# Patient Record
Sex: Male | Born: 1960 | Hispanic: No | Marital: Married | Smoking: Never smoker
Health system: Southern US, Community
[De-identification: ages and names within clinical notes are randomized; demographics above are authoritative.]

## PROBLEM LIST (undated history)

## (undated) ENCOUNTER — Ambulatory Visit

## (undated) DIAGNOSIS — G4730 Sleep apnea, unspecified: Secondary | ICD-10-CM

## (undated) DIAGNOSIS — Z8669 Personal history of other diseases of the nervous system and sense organs: Secondary | ICD-10-CM

## (undated) DIAGNOSIS — K219 Gastro-esophageal reflux disease without esophagitis: Secondary | ICD-10-CM

## (undated) DIAGNOSIS — D649 Anemia, unspecified: Secondary | ICD-10-CM

## (undated) DIAGNOSIS — S129XXA Fracture of neck, unspecified, initial encounter: Secondary | ICD-10-CM

## (undated) DIAGNOSIS — K819 Cholecystitis, unspecified: Secondary | ICD-10-CM

## (undated) DIAGNOSIS — K760 Fatty (change of) liver, not elsewhere classified: Secondary | ICD-10-CM

## (undated) DIAGNOSIS — S2239XA Fracture of one rib, unspecified side, initial encounter for closed fracture: Secondary | ICD-10-CM

## (undated) DIAGNOSIS — M179 Osteoarthritis of knee, unspecified: Secondary | ICD-10-CM

## (undated) DIAGNOSIS — M869 Osteomyelitis, unspecified: Secondary | ICD-10-CM

## (undated) DIAGNOSIS — S0291XA Unspecified fracture of skull, initial encounter for closed fracture: Secondary | ICD-10-CM

## (undated) HISTORY — DX: Anemia, unspecified: D64.9

## (undated) HISTORY — PX: LEG SURGERY: SHX1003

## (undated) HISTORY — PX: APPENDECTOMY: SHX54

## (undated) HISTORY — DX: Cholecystitis, unspecified: K81.9

## (undated) HISTORY — PX: COLONOSCOPY: SHX174

## (undated) HISTORY — DX: Gastro-esophageal reflux disease without esophagitis: K21.9

## (undated) HISTORY — PX: CHOLECYSTECTOMY: SHX55

---

## 2000-10-07 ENCOUNTER — Encounter: Payer: Self-pay | Admitting: Emergency Medicine

## 2000-10-07 ENCOUNTER — Emergency Department (HOSPITAL_COMMUNITY): Admission: EM | Admit: 2000-10-07 | Discharge: 2000-10-07 | Payer: Self-pay | Admitting: *Deleted

## 2000-10-11 ENCOUNTER — Ambulatory Visit (HOSPITAL_COMMUNITY): Admission: RE | Admit: 2000-10-11 | Discharge: 2000-10-11 | Payer: Self-pay | Admitting: General Surgery

## 2001-01-10 DIAGNOSIS — S0291XA Unspecified fracture of skull, initial encounter for closed fracture: Secondary | ICD-10-CM

## 2001-01-10 DIAGNOSIS — S2249XA Multiple fractures of ribs, unspecified side, initial encounter for closed fracture: Secondary | ICD-10-CM

## 2001-01-10 DIAGNOSIS — S129XXA Fracture of neck, unspecified, initial encounter: Secondary | ICD-10-CM

## 2001-01-10 HISTORY — DX: Unspecified fracture of skull, initial encounter for closed fracture: S02.91XA

## 2001-01-10 HISTORY — DX: Multiple fractures of ribs, unspecified side, initial encounter for closed fracture: S22.49XA

## 2001-01-10 HISTORY — DX: Fracture of neck, unspecified, initial encounter: S12.9XXA

## 2001-02-13 ENCOUNTER — Encounter: Payer: Self-pay | Admitting: General Surgery

## 2001-02-13 ENCOUNTER — Ambulatory Visit (HOSPITAL_COMMUNITY): Admission: RE | Admit: 2001-02-13 | Discharge: 2001-02-13 | Payer: Self-pay | Admitting: General Surgery

## 2001-02-22 ENCOUNTER — Ambulatory Visit (HOSPITAL_COMMUNITY): Admission: RE | Admit: 2001-02-22 | Discharge: 2001-02-22 | Payer: Self-pay | Admitting: General Surgery

## 2001-02-22 ENCOUNTER — Encounter: Payer: Self-pay | Admitting: General Surgery

## 2001-08-28 ENCOUNTER — Encounter (HOSPITAL_COMMUNITY): Admission: RE | Admit: 2001-08-28 | Discharge: 2001-09-27 | Payer: Self-pay

## 2001-09-18 ENCOUNTER — Encounter (HOSPITAL_COMMUNITY): Admission: RE | Admit: 2001-09-18 | Discharge: 2001-10-18 | Payer: Self-pay

## 2001-10-17 ENCOUNTER — Encounter (HOSPITAL_COMMUNITY): Admission: RE | Admit: 2001-10-17 | Discharge: 2001-11-16 | Payer: Self-pay | Admitting: Neurosurgery

## 2001-11-16 ENCOUNTER — Encounter: Payer: Self-pay | Admitting: Family Medicine

## 2001-11-16 ENCOUNTER — Ambulatory Visit (HOSPITAL_COMMUNITY): Admission: RE | Admit: 2001-11-16 | Discharge: 2001-11-16 | Payer: Self-pay | Admitting: Family Medicine

## 2002-09-02 ENCOUNTER — Observation Stay (HOSPITAL_COMMUNITY): Admission: EM | Admit: 2002-09-02 | Discharge: 2002-09-03 | Payer: Self-pay | Admitting: Emergency Medicine

## 2002-09-02 ENCOUNTER — Encounter: Payer: Self-pay | Admitting: Emergency Medicine

## 2003-02-19 ENCOUNTER — Ambulatory Visit (HOSPITAL_COMMUNITY): Admission: RE | Admit: 2003-02-19 | Discharge: 2003-02-19 | Payer: Self-pay | Admitting: Internal Medicine

## 2003-03-27 ENCOUNTER — Ambulatory Visit (HOSPITAL_COMMUNITY): Admission: RE | Admit: 2003-03-27 | Discharge: 2003-03-27 | Payer: Self-pay | Admitting: Family Medicine

## 2003-03-28 ENCOUNTER — Ambulatory Visit (HOSPITAL_COMMUNITY): Admission: RE | Admit: 2003-03-28 | Discharge: 2003-03-28 | Payer: Self-pay | Admitting: General Surgery

## 2003-10-03 ENCOUNTER — Ambulatory Visit (HOSPITAL_COMMUNITY): Admission: RE | Admit: 2003-10-03 | Discharge: 2003-10-03 | Payer: Self-pay | Admitting: Family Medicine

## 2006-09-13 ENCOUNTER — Emergency Department (HOSPITAL_COMMUNITY): Admission: EM | Admit: 2006-09-13 | Discharge: 2006-09-13 | Payer: Self-pay | Admitting: Emergency Medicine

## 2007-10-16 ENCOUNTER — Ambulatory Visit: Payer: Self-pay | Admitting: Gastroenterology

## 2007-10-16 DIAGNOSIS — R1310 Dysphagia, unspecified: Secondary | ICD-10-CM | POA: Insufficient documentation

## 2007-10-16 DIAGNOSIS — K625 Hemorrhage of anus and rectum: Secondary | ICD-10-CM | POA: Insufficient documentation

## 2007-10-16 DIAGNOSIS — R1013 Epigastric pain: Secondary | ICD-10-CM | POA: Insufficient documentation

## 2007-10-17 ENCOUNTER — Encounter: Payer: Self-pay | Admitting: Gastroenterology

## 2007-10-17 ENCOUNTER — Ambulatory Visit: Payer: Self-pay | Admitting: Gastroenterology

## 2007-10-18 ENCOUNTER — Encounter: Payer: Self-pay | Admitting: Gastroenterology

## 2007-10-22 ENCOUNTER — Encounter: Payer: Self-pay | Admitting: Gastroenterology

## 2007-10-23 ENCOUNTER — Encounter: Admission: RE | Admit: 2007-10-23 | Discharge: 2007-10-23 | Payer: Self-pay | Admitting: Neurology

## 2009-01-08 ENCOUNTER — Telehealth: Payer: Self-pay | Admitting: Gastroenterology

## 2010-05-28 NOTE — Op Note (Signed)
Alan Leblanc, Alan Leblanc                         ACCOUNT NO.:  [id number]   MEDICAL RECORD NO.:  [id number]                   PATIENT TYPE:  AMB   LOCATION:  DAY                                  FACILITY:  [facility]   PHYSICIAN:  Dalia Heading, M.D.               DATE OF BIRTH:  01-25-1960   DATE OF PROCEDURE:  03/28/2003  DATE OF DISCHARGE:                                 OPERATIVE REPORT   PREOPERATIVE DIAGNOSES:  Cholecystitis, cholelithiasis.   POSTOPERATIVE DIAGNOSES:  Cholecystitis, cholelithiasis.   PROCEDURE:  Laparoscopic cholecystectomy   SURGEON:  Dalia Heading, M.D.   ASSISTANT:  Bernerd Limbo. Leona Carry, M.D.   ANESTHESIA:  General endotracheal.   INDICATIONS:  The patient is a 50 year old Hispanic male who presents with  cholecystitis secondary to cholelithiasis.  The risks and benefits of the  procedure including bleeding, infection, hepatobiliary injury, and the  possibility of an open procedure were fully explained to the patient, who  gave informed consent.   DESCRIPTION OF PROCEDURE:  The patient was placed in the supine position.  After induction of general endotracheal anesthesia, the abdomen was prepped  and draped using the usual sterile technique with Betadine.  Surgical site  confirmation was performed.   An supraumbilical incision was made down to the fascia.  A Veress needle was  introduced into the abdominal cavity and confirmation of placement was done  using the saline drop test.  The abdomen was then insufflated to 16 mmHg  pressure.  An 11-mm trocar was introduced into the abdominal cavity under  direct visualization without difficulty.  The patient was placed in reverse  Trendelenburg position and an additional 11-mm trocar was placed in the  epigastric region and 5-mm trocars were placed in the right upper quadrant  and right flank regions.  The liver was inspected and noted to be within  normal limits.  The gallbladder was retracted superior and  laterally.   The dissection was begun around the infundibulum of the gallbladder.  The  cystic duct was first identified.  Its juncture to the infundibulum fully  identified.  Endoclips were placed proximally and distally on the cystic  duct; and the cystic duct was divided.  This was likewise done on the cystic  artery.  The gallbladder was then freed away from the gallbladder fossa  using Bovie electrocautery.  The gallbladder was delivered through the  epigastric trocar site using an EndoCatch bag.  The gallbladder fossa was  inspected and no abnormal bleeding or bile leakage was noted.  Surgicel was  placed in the gallbladder fossa.  All fluid and air were then evacuated from  the abdominal cavity prior to removal of the trocars.   All wounds were irrigated with normal saline.  All wounds were injected with  0.5% Sensorcaine.  The supraumbilical fascia was reapproximated using an #0  Vicryl interrupted suture. All skin incisions were closed using staples.  Betadine ointment and dry sterile dressings were applied.   All tape and needle counts correct at the end of the procedure.  The patient  was extubated in the operating room and went back to recovery room awake and  stable condition.   COMPLICATIONS:  None.   SPECIMENS:  Gallbladder with stones.   BLOOD LOSS:  Minimal.      ___________________________________________                                            Dalia Heading, M.D.   MAJ/MEDQ  D:  03/28/2003  T:  03/28/2003  Job:  [id number]   cc:   Dalia Heading, M.D.  [address], [place]  [place] [address]  Fax: [phone]   Madelin Rear. Sherwood Gambler, M.D.  [address]  [place]  [place] [address]  Fax: [phone]

## 2010-05-28 NOTE — H&P (Signed)
Alan Leblanc, Alan Leblanc                         ACCOUNT NO.:  [id number]   MEDICAL RECORD NO.:  [id number]                  PATIENT TYPE:   LOCATION:                                       FACILITY:  [facility]   PHYSICIAN:  Dalia Heading, M.D.               DATE OF BIRTH:  03-Oct-1960   DATE OF ADMISSION:  DATE OF DISCHARGE:                                HISTORY & PHYSICAL   CHIEF COMPLAINT:  Biliary colic, cholelithiasis.   HISTORY OF PRESENT ILLNESS:  The patient is a 50 year old Hispanic male who  is referred for evaluation and treatment of biliary colic secondary to  cholelithiasis.  He has been having intermittent episodes of right upper  quadrant abdominal pain, nausea, and bloating for 1 week.  He does have  fatty food intolerance.  No fever, chills, or jaundice have been noted.  The  symptoms are persisting.   PAST MEDICAL HISTORY:  Unremarkable.   PAST SURGICAL HISTORY:  Hemorrhoidectomy in 2002.   CURRENT MEDICATIONS:  Pain pills.   ALLERGIES:  PENICILLIN, though he has tolerated cephalosporins without  difficulty.   REVIEW OF SYSTEMS:  Noncontributory.   PHYSICAL EXAMINATION:  GENERAL:  The patient is a well-developed, well-  nourished Hispanic male in no acute distress.  VITAL SIGNS:  He is afebrile and vital signs are stable.  HEENT:  Reveals no scleral icterus.  LUNGS:  Clear to auscultation with equal breath sounds bilaterally.  HEART:  Reveals a regular rate and rhythm without S3, S4, or murmurs.  ABDOMEN:  Soft with slight tenderness now in the right upper quadrant to  palpation.  No hepatosplenomegaly, masses, or hernias are identified.   Ultrasound of the gallbladder reveals cholelithiasis with a normal common  bile duct.   IMPRESSION:  Cholecystitis, cholelithiasis.   PLAN:  The patient is scheduled for a laparoscopic cholecystectomy on March 28, 2003.  The risks and benefits of the procedure including bleeding,  infection, hepatobiliary injury, the  possibility of an open procedure were  fully explained to the patient, who gave informed consent.     ___________________________________________                                         Dalia Heading, M.D.   MAJ/MEDQ  D:  03/27/2003  T:  03/27/2003  Job:  [id number]   cc:   Madelin Rear. Sherwood Gambler, M.D.  [address]  [place]  [place] [address]  Fax: [phone]
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
## 2010-05-28 NOTE — H&P (Signed)
[facility]  Patient:    Alan Leblanc, Alan Leblanc Visit Number: [id number] MRN:           Service Type: Attending:  Franky Macho, M.D. Dictated by:   Franky Macho, M.D. Adm. Date:  10/11/00   CC:         Dorthey Sawyer, M.D.   History and Physical  DATE OF BIRTH:  12/22/1960  CHIEF COMPLAINT:  Thrombosed hemorrhoid.  HISTORY OF PRESENT ILLNESS:  The patient is a 50 year old Hispanic male who is referred for evaluation and treatment of a thrombosed hemorrhoid.  It has been hurting since last week.  He was seen in the emergency room this past weekend. No fever or chills had been noted.  He has had hemorrhoidal problems in the past.  PAST MEDICAL HISTORY:  Unremarkable.  PAST SURGICAL HISTORY:  Broken foot and appendectomy in the past.  CURRENT MEDICATIONS:  Anusol suppositories, Lortab for pain.  ALLERGIES:  PENICILLIN.  REVIEW OF SYSTEMS:  Patient denies drinking or smoking.  Denies any other cardiopulmonary difficulties or bleeding disorders.  PHYSICAL EXAMINATION  GENERAL:  Patient is a well-developed, well-nourished Hispanic male in no acute distress.  VITAL SIGNS:  Afebrile.  Vital signs are stable.  LUNGS:  Clear to auscultation with equal breath sounds bilaterally.  HEART:  Regular rate and rhythm without S3, S4, or murmurs.  ABDOMEN:  Unremarkable.  RECTAL:  Large thrombosed hemorrhoid noted along the left lateral aspect of the anus.  IMPRESSION:  Thrombosed hemorrhoid.  PLAN:  The patient is scheduled for a hemorrhoidectomy on October 11, 2000. The risks and benefits of the procedure including bleeding, infection, the possibility of recurrence were fully explained to the patient, gave informed consent. Dictated by:   Franky Macho, M.D. Attending:  Franky Macho, M.D. DD:  10/10/00 TD:  10/10/00 Job: [id number]

## 2010-05-28 NOTE — Op Note (Signed)
[facility]  Patient:    MONT, JAGODA Visit Number: [id number] MRN: [id number]          Service Type: DSU Location: DAY Attending Physician:  Dalia Heading Dictated by:   Franky Macho, M.D. Proc. Date: 10/11/00 Admit Date:  10/11/2000   CC:         Dorthey Sawyer, M.D.   Operative Report  AGE:  50 years old.  PREOPERATIVE DIAGNOSIS:  Thrombosed hemorrhoid.  POSTOPERATIVE DIAGNOSIS:  Thrombosed hemorrhoid.  PROCEDURE:  Internal and external hemorrhoidectomy.  SURGEON:  Franky Macho, M.D.  ANESTHESIA:  General.  INDICATIONS:  Patient is a 50 year old Hispanic male who presents with a thrombosed hemorrhoid along the left aspect of the anus.  The risks and benefits of the procedure including bleeding, infection, and recurrence of the hemorrhoidal disease were fully explained to the patient, and gave informed consent.  DESCRIPTION OF PROCEDURE:  Patient was placed in the lithotomy position after general anesthesia was administered.  The perineum was prepped and draped using the usual sterile technique with Betadine.  Patient was noted to have a significantly enlarged internal and external hemorrhoid encompassing the left side of the anus.  A smaller internal thrombosed hemorrhoid was noted at the 8 oclock position.  The external hemorrhoid was excised to the dentate line.  Any bleeding was controlled using Bovie electrocautery.  Care was taken to avoid the external sphincter muscle. The mucosal edges were reapproximated using a running 2-0 Vicryl suture. Sensorcaine 0.5% was instilled into the surrounding perineum and the rectum was packed with Surgicel and viscous Xylocaine packing.  All tip and needle counts were correct at the end of the procedure. The patient was awakened and transferred to PACU in stable condition.  COMPLICATIONS:  None.  SPECIMEN:  Thrombosed hemorrhoid.  BLOOD LOSS:  Minimal. Dictated by:   Franky Macho,  M.D. Attending Physician:  Dalia Heading DD:  10/11/00 TD:  10/11/00 Job: [id number]

## 2013-04-26 ENCOUNTER — Encounter: Payer: Self-pay | Admitting: Gastroenterology

## 2013-06-11 ENCOUNTER — Encounter: Payer: Self-pay | Admitting: Gastroenterology

## 2013-09-27 ENCOUNTER — Encounter: Payer: Self-pay | Admitting: Internal Medicine

## 2013-11-21 ENCOUNTER — Encounter: Payer: Self-pay | Admitting: Physician Assistant

## 2013-11-29 ENCOUNTER — Ambulatory Visit: Payer: Self-pay | Admitting: Internal Medicine

## 2013-11-29 ENCOUNTER — Ambulatory Visit (INDEPENDENT_AMBULATORY_CARE_PROVIDER_SITE_OTHER): Payer: 59 | Admitting: Physician Assistant

## 2013-11-29 ENCOUNTER — Encounter: Payer: Self-pay | Admitting: Physician Assistant

## 2013-11-29 VITALS — BP 108/68 | HR 68 | Ht 65.5 in | Wt 201.6 lb

## 2013-11-29 DIAGNOSIS — K219 Gastro-esophageal reflux disease without esophagitis: Secondary | ICD-10-CM

## 2013-11-29 DIAGNOSIS — R1013 Epigastric pain: Secondary | ICD-10-CM

## 2013-11-29 MED ORDER — OMEPRAZOLE 40 MG PO CPDR: 40.0000 mg | DELAYED_RELEASE_CAPSULE | Freq: Every day | ORAL | Status: DC

## 2013-11-29 NOTE — Patient Instructions (Signed)

## 2013-11-29 NOTE — Progress Notes (Signed)
Patient ID: Alan Leblanc, male   DOB: [birth year], 53 y.o.   MRN: [id number]   Subjective:    Patient ID: Alan Leblanc, male    DOB: 08-Mar-1960, 53 y.o.   MRN: [id number]  HPI Alan Leblanc is a pleasant 53 year old Hispanic male known to Dr. Deatra Ina. He had undergone a colonoscopy in October 2009 and was found to have a diminutive rectal polyp. This was removed in path showed benign tissue no adenomatous change. Patient is generally in good health, is employed as a Administrator. He is status post appendectomy and cholecystectomy. He comes in today with complaints of acid reflux. He says that he has had reflux symptoms for years and that it does not necessarily any worse at this time. He had been given a prescription for omeprazole by a primary care physician sometime ago and says that he is almost out. He says if he takes the Prilosec regularly it helps him a lot. He says his symptoms are daily at times and dependent on what he eats. He says when he is at home and eats his wife's spicy cooking he has a lot of heartburn and indigestion, when he is on the road he seems to do better He also endorses nocturnal symptoms and says when he is having problems he will wake up frequently with sour brash and sometimes choking. He denies any dysphagia or odynophagia but says he does occasionally have a full sensation in his esophagus. He occasionally will have some trouble with pills. He also complains of epigastric discomfort which he seems to notice more when lifting something heavy and he feels that he does get a bulge in his epigastrium as well there's no sharp pain nausea etc. associated.  Review of Systems  Outpatient Encounter Prescriptions as of 11/29/2013  Medication Sig  . omeprazole (PRILOSEC) 40 MG capsule Take 1 capsule (40 mg total) by mouth daily.  . tamsulosin (FLOMAX) 0.4 MG CAPS capsule Take 0.4 mg by mouth as needed.  . [DISCONTINUED] omeprazole (PRILOSEC) 40 MG capsule Take 1 capsule (40 mg  total) by mouth daily.   Allergies  Allergen Reactions  . Penicillins    Patient Active Problem List   Diagnosis Date Noted  . HEMORRHAGE OF RECTUM AND ANUS 10/16/2007  . ABDOMINAL PAIN, EPIGASTRIC 10/16/2007   History   Social History  . Marital Status: Married    Spouse Name: N/A    Number of Children: N/A  . Years of Education: N/A   Occupational History  . Not on file.   Social History Main Topics  . Smoking status: Never Smoker   . Smokeless tobacco: Not on file  . Alcohol Use: No  . Drug Use: No  . Sexual Activity: Not on file   Other Topics Concern  . Not on file   Social History Narrative    Alan Leblanc family history includes Osteoarthritis in his mother; Tuberculosis in his father.      Objective:    Filed Vitals:   11/29/13 1347  BP: 108/68  Pulse: 68    Physical Exam  well-developed Hispanic male in no acute distress, pleasant blood pressure 108/68 pulse 68 height 5 foot 5 weight 201. HEENT; nontraumatic normocephalic EOMI PERRLA sclerae anicteric, Supple; no JVD, Cardiovascular; regular rate and rhythm with S1-S2 no murmur or gallop, Pulm; clear bilaterally, Abdomen; soft and nontender there is no palpable mass or hepatosplenomegaly I believe he does have a small ventral hernia in the epigastrium no palpable mass or hepatosplenomegaly  bowel sounds are present, Rectal ;exam not done, Extremities; no clubbing cyanosis or edema skin warm and dry,Psych;mood and affect appropriate       Assessment & Plan:   #39 53 year old Hispanic male with chronic GERD-generally controlled with omeprazole No prior endoscopic evaluation, rule out Barrett's #2 small ventral hernia, epigastric #3 status post cholecystectomy #4 colon neoplasia screening-patient had a colonoscopy in October 2009 and will need follow-up in 2019  Plan; reviewed in anti-reflux regimen particularly with attention to nocturnal symptoms Prescription given for omeprazole 40 mg by mouth every  morning 11 refills Schedule for upper endoscopy to screen for Barrett's with Dr. Deatra Ina. Procedure discussed in detail with the patient and he is agreeable to proceed  Alfredia Ferguson PA-C 11/29/2013

## 2013-12-02 NOTE — Progress Notes (Signed)
Reviewed and agree with management. Robert D. Kaplan, M.D., FACG  

## 2013-12-12 ENCOUNTER — Encounter: Payer: 59 | Admitting: Gastroenterology

## 2015-04-04 ENCOUNTER — Emergency Department (HOSPITAL_COMMUNITY)
Admission: EM | Admit: 2015-04-04 | Discharge: 2015-04-04 | Disposition: A | Discharge status: Home / Self Care | Payer: BLUE CROSS/BLUE SHIELD | Attending: Emergency Medicine | Admitting: Emergency Medicine

## 2015-04-04 ENCOUNTER — Encounter (HOSPITAL_COMMUNITY): Payer: Self-pay | Admitting: *Deleted

## 2015-04-04 DIAGNOSIS — K922 Gastrointestinal hemorrhage, unspecified: Secondary | ICD-10-CM

## 2015-04-04 DIAGNOSIS — K439 Ventral hernia without obstruction or gangrene: Secondary | ICD-10-CM

## 2015-04-04 DIAGNOSIS — R1010 Upper abdominal pain, unspecified: Secondary | ICD-10-CM | POA: Diagnosis present

## 2015-04-04 DIAGNOSIS — K279 Peptic ulcer, site unspecified, unspecified as acute or chronic, without hemorrhage or perforation: Secondary | ICD-10-CM | POA: Diagnosis not present

## 2015-04-04 DIAGNOSIS — D649 Anemia, unspecified: Secondary | ICD-10-CM | POA: Diagnosis not present

## 2015-04-04 LAB — CBC WITH DIFFERENTIAL/PLATELET
BASOS ABS: 0 10*3/uL (ref 0.0–0.1)
BASOS PCT: 0 %
Eosinophils Absolute: 0.4 10*3/uL (ref 0.0–0.7)
Eosinophils Relative: 5 %
HEMATOCRIT: 31.3 % — AB (ref 39.0–52.0)
HEMOGLOBIN: 9.5 g/dL — AB (ref 13.0–17.0)
Lymphocytes Relative: 27 %
Lymphs Abs: 2.4 10*3/uL (ref 0.7–4.0)
MCH: 22 pg — ABNORMAL LOW (ref 26.0–34.0)
MCHC: 30.4 g/dL (ref 30.0–36.0)
MCV: 72.5 fL — ABNORMAL LOW (ref 78.0–100.0)
Monocytes Absolute: 0.9 10*3/uL (ref 0.1–1.0)
Monocytes Relative: 10 %
NEUTROS ABS: 5 10*3/uL (ref 1.7–7.7)
NEUTROS PCT: 58 %
Platelets: 240 10*3/uL (ref 150–400)
RBC: 4.32 MIL/uL (ref 4.22–5.81)
RDW: 16.4 % — ABNORMAL HIGH (ref 11.5–15.5)
WBC: 8.7 10*3/uL (ref 4.0–10.5)

## 2015-04-04 LAB — COMPREHENSIVE METABOLIC PANEL
ALBUMIN: 3.8 g/dL (ref 3.5–5.0)
ALK PHOS: 91 U/L (ref 38–126)
ALT: 37 U/L (ref 17–63)
AST: 27 U/L (ref 15–41)
Anion gap: 7 (ref 5–15)
BILIRUBIN TOTAL: 0.4 mg/dL (ref 0.3–1.2)
BUN: 14 mg/dL (ref 6–20)
CALCIUM: 8.8 mg/dL — AB (ref 8.9–10.3)
CO2: 25 mmol/L (ref 22–32)
CREATININE: 0.78 mg/dL (ref 0.61–1.24)
Chloride: 108 mmol/L (ref 101–111)
GFR calc Af Amer: >60 mL/min (ref >60)
GFR calc non Af Amer: >60 mL/min (ref >60)
GLUCOSE: 120 mg/dL — AB (ref 65–99)
POTASSIUM: 3.6 mmol/L (ref 3.5–5.1)
Sodium: 140 mmol/L (ref 135–145)
Total Protein: 7.6 g/dL (ref 6.5–8.1)

## 2015-04-04 LAB — LIPASE, BLOOD: Lipase: 32 U/L (ref 11–51)

## 2015-04-04 MED ORDER — OMEPRAZOLE 20 MG PO CPDR: 20.0000 mg | DELAYED_RELEASE_CAPSULE | Freq: Two times a day (BID) | ORAL | Status: DC

## 2015-04-04 MED ORDER — FERROUS SULFATE 325 (65 FE) MG PO TABS: 325.0000 mg | ORAL_TABLET | Freq: Two times a day (BID) | ORAL | Status: DC

## 2015-04-04 MED ORDER — PANTOPRAZOLE SODIUM 40 MG PO TBEC
40.0000 mg | DELAYED_RELEASE_TABLET | Freq: Once | ORAL | Status: AC
  Administered 2015-04-04: 40 mg via ORAL
  Filled 2015-04-04: qty 1

## 2015-04-04 NOTE — ED Notes (Signed)
Pt states he has been having pain in his mid abd for over a year and was dx with a hernia, approx. Three weeks ago pt started having left sided abd pain that comes and goes. Pt also c/o blood in his stools, states hx of hemorrhoids.

## 2015-04-04 NOTE — ED Provider Notes (Signed)
CSN: [id number]     Arrival date & time 04/04/15  1841 History   First MD Initiated Contact with Patient 04/04/15 1921     Chief Complaint  Patient presents with  . Abdominal Pain     HPI  She presents evaluation of abdominal pain. States he has 2 separate issues. He knows he has a hernia as he feels a bulging in his upper abdomen. States occasionally will "pop out", and he has to "push it back". This been happening more frequently recently. Also describes as a burning sensation in his chest and upper epigastrium. Taking Prilosec and it was sent with fairly well-controlled. Ran out of his Prilosec this week. Has not had any dark stools or bloody stools. Denies weakness dizziness or lightheadedness. Questions if he has an ulcer occasionally has some postprandial fullness. He had a normal colonoscopy 2 years ago. His never had upper GI.  Past Medical History  Diagnosis Date  . GERD (gastroesophageal reflux disease)   . Cholecystitis   . Anemia    Past Surgical History  Procedure Laterality Date  . Appendectomy    . Cholecystectomy    . Leg surgery      right   Family History  Problem Relation Age of Onset  . Osteoarthritis Mother   . Tuberculosis Father    Social History  Substance Use Topics  . Smoking status: Never Smoker   . Smokeless tobacco: None  . Alcohol Use: No    Review of Systems  Constitutional: Negative for fever, chills, diaphoresis, appetite change and fatigue.  HENT: Negative for mouth sores, sore throat and trouble swallowing.   Eyes: Negative for visual disturbance.  Respiratory: Negative for cough, chest tightness, shortness of breath and wheezing.   Cardiovascular: Negative for chest pain.  Gastrointestinal: Positive for abdominal pain. Negative for nausea, vomiting, diarrhea, blood in stool and abdominal distention.  Endocrine: Negative for polydipsia, polyphagia and polyuria.  Genitourinary: Negative for dysuria, frequency and hematuria.   Musculoskeletal: Negative for gait problem.  Skin: Negative for color change, pallor and rash.  Neurological: Negative for dizziness, syncope, light-headedness and headaches.  Hematological: Does not bruise/bleed easily.  Psychiatric/Behavioral: Negative for behavioral problems and confusion.      Allergies  Penicillins  Home Medications   Prior to Admission medications   Medication Sig Start Date End Date Taking? Authorizing Provider  Multiple Vitamin (MULTIVITAMIN WITH MINERALS) TABS tablet Take 1 tablet by mouth daily.   Yes Historical Provider, MD  ferrous sulfate 325 (65 FE) MG tablet Take 1 tablet (325 mg total) by mouth 2 (two) times daily with a meal. 04/04/15   Tanna Furry, MD  omeprazole (PRILOSEC) 20 MG capsule Take 1 capsule (20 mg total) by mouth 2 (two) times daily. 04/04/15   Tanna Furry, MD   BP 134/64 mmHg  Pulse 57  Temp(Src) 98.7 F (37.1 C) (Oral)  Resp 18  Ht 5\' 6"  (1.676 m)  Wt 210 lb (95.255 kg)  BMI 33.91 kg/m2  SpO2 97% Physical Exam  Constitutional: He is oriented to person, place, and time. He appears well-developed and well-nourished. No distress.  HENT:  Head: Normocephalic.  Eyes: Conjunctivae are normal. Pupils are equal, round, and reactive to light. No scleral icterus.  Neck: Normal range of motion. Neck supple. No thyromegaly present.  Cardiovascular: Normal rate and regular rhythm.  Exam reveals no gallop and no friction rub.   No murmur heard. Pulmonary/Chest: Effort normal and breath sounds normal. No respiratory distress. He has no  wheezes. He has no rales.  Abdominal: Soft. Bowel sounds are normal. He exhibits no distension. There is no tenderness. There is no rebound.  Has a palpable softness in his midline upper abdomen consistent with ventral hernia. This does bulge but is not prominent with obvious protrusion of intra-abdominal contents. This does reproduce his symptoms.  Maintain her of his abdomen is soft and benign.   Musculoskeletal: Normal range of motion.  Neurological: He is alert and oriented to person, place, and time.  Skin: Skin is warm and dry. No rash noted.  Psychiatric: He has a normal mood and affect. His behavior is normal.    ED Course  Procedures (including critical care time) Labs Review Labs Reviewed  CBC WITH DIFFERENTIAL/PLATELET - Abnormal; Notable for the following:    Hemoglobin 9.5 (*)    HCT 31.3 (*)    MCV 72.5 (*)    MCH 22.0 (*)    RDW 16.4 (*)    All other components within normal limits  COMPREHENSIVE METABOLIC PANEL - Abnormal; Notable for the following:    Glucose, Bld 120 (*)    Calcium 8.8 (*)    All other components within normal limits  LIPASE, BLOOD    Imaging Review No results found. I have personally reviewed and evaluated these images and lab results as part of my medical decision-making.   EKG Interpretation None      MDM   Final diagnoses:  Anemia, unspecified anemia type  Lower GI bleed  PUD (peptic ulcer disease)  Ventral hernia without obstruction or gangrene     Patient declines rectal exam. Tolerating symptoms well without dizziness lightheadedness hypotension or tachycardia. Referred to surgery regarding his ventral hernia. Regarding his GI bleeding was referred to GI. His Prilosec. Iron prescription. Recheck here any lightheadedness weakness dizziness or new or worsening symptoms.   Tanna Furry, MD 04/04/15 [phone]

## 2015-04-04 NOTE — ED Notes (Addendum)
Pt states he is having upper abdominal pain. Pt states he has a tender spot in the middle of his upper abdomen. Pt questioning is he has an ulcer.? Pt states he was told he has a hernia there. Pt denies n/v.

## 2015-04-04 NOTE — Discharge Instructions (Signed)
Call Dr. Arnoldo Morale to discuss outpatient repair of your ventral hernia. Call Dr. Sydell Axon to discuss endoscopies to locate the source of your GI blood loss. Return here with worsening bleeding, lightheadedness, dizziness, or passing out.     Anemia inespecfica (Anemia, Nonspecific) La anemia es una enfermedad en la que la concentracin de glbulos rojos o el nivel de hemoglobina en la sangre estn por debajo de lo normal. La hemoglobina es la sustancia de los glbulos rojos que lleva el oxgeno a todo el cuerpo. La anemia da como resultado que los tejidos no reciban la cantidad suficiente de oxgeno.  CAUSAS  Las causas ms frecuentes de anemia son:   Alan Leblanc. El sangrado puede ser interno o externo. Incluye sangrado excesivo debido al perodo (en las mujeres) o por los intestinos.   Dficit nutricional.   Enfermedad renal, tiroidea o heptica crnicas.  Enfermedades de la mdula sea que disminuyen la produccin de glbulos rojos.  Cncer y tratamientos para Science writer.  VIH, sida y sus tratamientos.  Trastornos del bazo que aumentan la destruccin de glbulos rojos.  Enfermedades de Alan Leblanc.  Destruccin excesiva de glbulos rojos debido a una infeccin, a medicamentos y a Alan Leblanc, Alan Leblanc. SIGNOS Y SNTOMAS   Debilidad leve.   Mareos.   Dolor de [place].  Palpitaciones.   Falta de aire, especialmente con el ejercicio.   Palidez.  Sensibilidad al fro.  Indigestin.  Nuseas.  Dificultad para dormir.  Dificultad para concentrarse. Los sntomas pueden ocurrir repentinamente o pueden Psychologist, forensic.  DIAGNSTICO  Con frecuencia es necesario realizar anlisis de [employer]. Estos ayudan al profesional a Adult Alan Leblanc. Su mdico controlar la materia fecal para Hydrographic surveyor la presencia de [place] y buscar otras causas de prdida de [place].  TRATAMIENTO  El tratamiento vara segn la causa de la anemia. Las  opciones de tratamiento son:   Suplementos de hierro, vitamina [id number], o cido flico.   Medicamentos con hormonas.   Transfusin de [place]. Ser necesaria en los casos de prdida de [place] grave.   Hospitalizacin. Ser necesaria si la prdida de sangre es continua y significativa.   Cambios en la dieta.  Extirpacin del bazo. INSTRUCCIONES PARA EL CUIDADO EN EL HOGAR Cumpla con todas las visitas de control. Generalmente demora varias semanas corregir la anemia, y es muy importante que el mdico controle su enfermedad y su respuesta al [place]. SOLICITE ATENCIN MDICA DE INMEDIATO SI:   Siente debilidad extrema, falta de aire o dolor en el pecho.   Se siente mareado o tiene dificultad para concentrarse.  Tiene una hemorragia vaginal abundante.   Aparece una erupcin cutnea.   La materia fecal es negra, de aspecto alquitranado.   Se desmaya.   Vomita sangre.   Vomita repetidas veces.   Siente dolor abdominal.  Tiene fiebre o sntomas persistentes durante ms de 2 - 3 das.   Tiene fiebre y los sntomas empeoran repentinamente.   Se deshidrata.  ASEGRESE DE QUE:  Comprende estas instrucciones.  Controlar su afeccin.  Recibir ayuda de inmediato si no mejora o si empeora.   Esta informacin no tiene Marine scientist el consejo del mdico. Asegrese de hacerle al mdico cualquier pregunta que tenga.   Document Released: 12/27/2004 Document Revised: 08/29/2012 Elsevier Interactive Patient Education 2016 [place].  Hemorrogia Gastrointestinal  (Gastrointestinal Bleeding)  La hemorragia gastrointestinal (GI) es el sangrado en algn lugar del tracto digestivo, entre la boca y el ano. CAUSAS  Hay diferentes afecciones que pueden causar [employer]  hemorragia gastrointestinal. Las causas posibles son:   Esofagitis. Es la inflamacin, irritacin o hinchazn del esfago.  Hemorroides.Son venas en el recto que estn llenas (congestionadas) con  sangre. Causan dolor, inflamacin y Solicitor.  Fisura anal.Son reas que se han desgarrado, que duelen y Solicitor. Generalmente se producen cuando se evaca materia fecal dura.  Diverticulosis.Son bolsitas que se forman en el colon con la edad, y pueden sangrar significativamente.  Diverticulitis.Es una inflamacin de las zonas con diverticulosis. Puede causar dolor, fiebre, materia fecal con [place] las hemorragias son raras.  Plipos y cncer. El cncer de colon generalmente comienza como plipos precancerosos.  Gastritis y lceras.Las hemorragias del tracto gastrointestinal superior (cerca del estmago) pueden bajar hacia los intestinos y producir heces de color negro, aspecto alquitranado y de mal olor. En ciertos casos, si la hemorragia es lo suficientemente rpida, la materia fecal no es negra, sino roja. Este trastorno puede poner en peligro la vida. SNTOMAS   Vomita sangre de color rojo brillante o material que parezca borra de caf.  Heces con sangre o de aspecto negro alquitranado. DIAGNSTICO  Su mdico puede diagnosticar el trastorno mediante la historia clnica y el examen fsico. Puede ser necesario realizar ms pruebas, por ejemplo:   Radiografas u otros estudios por imgenes.  Esofagogastroduodenoscopa (EGD). En esta prueba se [place] un tubo flexible para observar el esfago, el estmago y el intestino delgado.  Colonoscopa. En esta prueba se utiliza un tubo flexible con luz para observar el interior del colon. TRATAMIENTO  El tratamiento depende de la causa del sangrado.   Para las hemorragias del esfago, [place], intestino delgado o del colon, el mdico le realizar una EGD o una colonoscopa lo que podra permitirle, como parte del procedimiento, Psychologist, educational.  La inflamacin o infeccin del colon se puede tratar con medicamentos.  Muchos de los problemas rectales pueden tratarse con cremas, supositorios o baos calientes.  A  veces es necesario realizar [place].  Las transfusiones de sangre son a veces necesarias si ha perdido Optometrist. Si el sangrado es lento, se le permitir ir a Medical illustrator. Si hay mucho sangrado, deber permanecer en el hospital para observacin.  [place] los medicamentos exactamente como fueron recetados.  Mantenga sus heces blandas consumiendo alimentos con gran contenido de [place]. Estos alimentos incluyen granos enteros, legumbres, frutas y verduras. Las ciruelas (1 a [place]) tienen un buen Education officer, museum.  Beba suficiente cantidad de lquido para mantener la orina clara o de color amarillo plido. SOLICITE ATENCIN MDICA DE INMEDIATO SI:   La hemorragia aumenta.  Se siente mareado, dbil, o se desmaya.  Tiene clicos intensos en la espalda o en el abdomen.  Elimina grandes cogulos de sangre con las heces.  Los sntomas empeoran. ASEGRESE DE QUE:   Comprende estas instrucciones.  Controlar su enfermedad.  Solicitar ayuda de inmediato si no mejora o si empeora.   Esta informacin no tiene Marine scientist el consejo del mdico. Asegrese de hacerle al mdico cualquier pregunta que tenga.   Document Released: 12/27/2004 Document Revised: 12/14/2011 Elsevier Interactive Patient Education 2016 [place] ([place], Adult) Una hernia ocurre cuando un rgano o un tejido interno se protruye a travs de un punto debilitado del vientre (abdomen). CUIDADOS EN EL HOGAR  No estire ni use en exceso (sobrecargue) los msculos que estn cerca de la hernia.  No levante ningn objeto que pese  ms de 10libras (4,5kg).  Para levantar objetos, use los msculos de las piernas. No use los msculos de la espalda.  Cuando tosa, hgalo con suavidad.  Consuma una dieta con alto contenido de [place]. Coma gran cantidad de frutas y verduras.  Beba suficiente lquido para mantener el pis (orina) claro o de  color amarillo plido. Trate de beber 6 u 8vasos de Public affairs consultant.  Tome medicamentos para ablandar la materia fecal (ablandadores de heces) como se lo haya indicado el mdico.  Baje de [place], si tiene sobrepeso.  No consuma ningn producto que contenga tabaco, lo que incluye cigarrillos, tabaco de Higher education careers adviser o Psychologist, sport and exercise. Si necesita ayuda para dejar de fumar, consulte al mdico.  Concurra a todas las visitas de control como se lo haya indicado el mdico. Esto es importante. SOLICITE AYUDA SI:  La piel que rodea la hernia se inflama (hincha) o se enrojece.  Le duele la hernia. SOLICITE AYUDA DE INMEDIATO SI:  Tiene fiebre.  Siente dolor abdominal que empeora.  Tiene malestar estomacal (nuseas) o vomita.  No puede volver a Public affairs consultant hernia en su lugar al ejercer sobre esta una presin suave mientras est acostado.  La hernia:  Cambia de forma o de tamao.  Se le atasca fuera del vientre.  Cambia de color.  Est dura al tacto o le causa dolor a la palpacin.   Esta informacin no tiene Marine scientist el consejo del mdico. Asegrese de hacerle al mdico cualquier pregunta que tenga.   Document Released: 10/17/2012 Document Revised: 01/17/2014 Elsevier Interactive Patient Education 2016 [place] pptica  (Peptic Ulcer) La lcera pptica es una llaga dolorosa en la membrana que recubre el esfago, el estmago o la primera parte del intestino delgado. La causa principal de la lcera puede ser:    Ardelia Mems infeccin.  El uso de determinados medicamentos con mucha frecuencia o en gran cantidad.  El hbito de fumar. CUIDADOS EN EL HOGAR   Evite el consumo de cigarrillos, de alcohol y cafena.  Evite los alimentos que le hagan mal.  Slo tome los medicamentos que le indique el mdico. No tome medicamentos que su mdico no haya autorizado.  Cumpla con los controles mdicos segn las indicaciones. SOLICITE AYUDA SI:  No mejora luego de 7 das  de iniciado el tratamiento.  Contina sintiendo Engineer, site (indigestin) o Geographical information systems officer. SOLICITE AYUDA DE INMEDIATO SI:   Siente un dolor sbito y agudo o que no se le pasa en el vientre (abdominal).  La materia fecal (heces) es sanguinolenta, negra o de aspecto alquitranado.  Expulsa por la boca (vomita) sangre o un material similar a la borra del caf.  Se siente dbil, sufre mareos o siente que se va a desvanecer (se desmaya).  Est muy transpirado o se siente pegajoso o fro al tacto (sudoroso). ASEGRESE DE QUE:   Comprende estas instrucciones.  Controlar su enfermedad.  Solicitar ayuda de inmediato si no mejora o si empeora.   Esta informacin no tiene Marine scientist el consejo del mdico. Asegrese de hacerle al mdico cualquier pregunta que tenga.   Document Released: 01/29/2010 Document Revised: 01/17/2014 Elsevier Interactive Patient Education [employer].

## 2015-04-06 ENCOUNTER — Telehealth: Payer: Self-pay | Admitting: Nurse Practitioner

## 2015-04-06 ENCOUNTER — Encounter: Payer: Self-pay | Admitting: Internal Medicine

## 2015-04-06 NOTE — Telephone Encounter (Signed)
OV made and letter mailed °

## 2015-04-06 NOTE — Telephone Encounter (Signed)
Please advise if we can accept patient as a new patient. He was seen in the ED recently.

## 2015-04-06 NOTE — Telephone Encounter (Signed)
Patient previously saw [facility] last in 2010, but lives in [place]. Ok to schedule as new patient.

## 2015-04-15 ENCOUNTER — Other Ambulatory Visit: Payer: Self-pay

## 2015-04-15 ENCOUNTER — Ambulatory Visit (INDEPENDENT_AMBULATORY_CARE_PROVIDER_SITE_OTHER): Payer: BLUE CROSS/BLUE SHIELD | Admitting: Nurse Practitioner

## 2015-04-15 ENCOUNTER — Encounter: Payer: Self-pay | Admitting: Nurse Practitioner

## 2015-04-15 VITALS — BP 133/83 | HR 57 | Temp 97.2°F | Ht 66.0 in | Wt 216.6 lb

## 2015-04-15 DIAGNOSIS — K219 Gastro-esophageal reflux disease without esophagitis: Secondary | ICD-10-CM

## 2015-04-15 DIAGNOSIS — D649 Anemia, unspecified: Secondary | ICD-10-CM

## 2015-04-15 DIAGNOSIS — K921 Melena: Secondary | ICD-10-CM | POA: Diagnosis not present

## 2015-04-15 MED ORDER — PEG 3350-KCL-NA BICARB-NACL 420 G PO SOLR: 4000.0000 mL | Freq: Once | ORAL | Status: DC

## 2015-04-15 NOTE — Progress Notes (Signed)
CC'ED TO PCP 

## 2015-04-15 NOTE — Progress Notes (Signed)
Primary Care Physician:  No PCP Per Patient Primary Gastroenterologist:  Dr. Gala Romney  Chief Complaint  Patient presents with  . Anemia  . Gastroesophageal Reflux    HPI:   Alan Leblanc is a 55 y.o. male who presents on referral from the emergency department. At that time he admitted a known abdominal hernia in his upper abdomen which occasionally pops out and has been occurring more frequently lately. Also burning sensation in his epigastric area. Takes Prilosec which is fairly well controlled. Denied any bleeding or melena. States normal colonoscopy 2 years ago, never had upper endoscopy. Soft ventral hernia noted without red flag/warning signs or symptoms on exam. He was noted to be anemic with a hemoglobin of 9.5. A referral to surgery was made regarding the ventral hernia and he was started on iron by the emergency room physician. Last colonoscopy found in our system dated 10/17/2007 which found a 2 mm rectal polyp found to be polypoid colonic mucosa was benign lymphoid aggregate and without adenomatous change. No EGD history in our system.  Today he states his last colonoscopy was in 2009. Has never had an EGD. He has had esophageal burning and left-sided anterior abdominal pain. This pain has resolved. Is still having reflux, is on Prilosec. Minimal breakthrough GERD symptoms when on PPI. Admits hematochezia with occasional clots, has a history of hemorrhoids. Denies melena. Has a bowel movement daily with straining but stools are not hard. No on anything for constipation. Denies N/V, unintentional weight loss, fever, chills. Denies chest pain, dyspnea, dizziness, lightheadedness, syncope, near syncope. Denies any other upper or lower GI symptoms. Avoids NSAIDS and ASA powders.  Past Medical History  Diagnosis Date  . GERD (gastroesophageal reflux disease)   . Cholecystitis   . Anemia     Past Surgical History  Procedure Laterality Date  . Appendectomy    . Cholecystectomy    .  Leg surgery      right    Current Outpatient Prescriptions  Medication Sig Dispense Refill  . ferrous sulfate 325 (65 FE) MG tablet Take 1 tablet (325 mg total) by mouth 2 (two) times daily with a meal. 60 tablet 3  . omeprazole (PRILOSEC) 20 MG capsule Take 1 capsule (20 mg total) by mouth 2 (two) times daily. 60 capsule 1   No current facility-administered medications for this visit.    Allergies as of 04/15/2015 - Review Complete 04/15/2015  Allergen Reaction Noted  . Penicillins Other (See Comments)     Family History  Problem Relation Age of Onset  . Osteoarthritis Mother   . Tuberculosis Father   . Colon cancer Neg Hx     Limited knowledge of family history    Social History   Social History  . Marital Status: Married    Spouse Name: N/A  . Number of Children: N/A  . Years of Education: N/A   Occupational History  . Not on file.   Social History Main Topics  . Smoking status: Never Smoker   . Smokeless tobacco: Never Used  . Alcohol Use: No  . Drug Use: No  . Sexual Activity: Not on file   Other Topics Concern  . Not on file   Social History Narrative    Review of Systems: General: Negative for anorexia, weight loss, fever, chills, fatigue, weakness. Eyes: Negative for vision changes.  ENT: Negative for hoarseness, difficulty swallowing. CV: Negative for chest pain, angina, palpitations,peripheral edema.  Respiratory: Negative for dyspnea at rest, cough,  sputum, wheezing.  GI: See history of present illness.  Derm: Negative for rash or itching.  Endo: Negative for unusual weight change.  Heme: Negative for bruising or bleeding. Allergy: Negative for rash or hives.    Physical Exam: BP 133/83 mmHg  Pulse 57  Temp(Src) 97.2 F (36.2 C) (Oral)  Ht 5\' 6"  (1.676 m)  Wt 216 lb 9.6 oz (98.249 kg)  BMI 34.98 kg/m2 General:   Alert and oriented. Pleasant and cooperative. Well-nourished and well-developed.  Head:  Normocephalic and  atraumatic. Eyes:  Without icterus, sclera clear and conjunctiva pink.  Ears:  Normal auditory acuity. Cardiovascular:  S1, S2 present without murmurs appreciated. Extremities without clubbing or edema. Respiratory:  Clear to auscultation bilaterally. No wheezes, rales, or rhonchi. No distress.  Gastrointestinal:  +BS, soft, non-tender and non-distended. No guarding or rebound. Rectal:  Deferred  Musculoskalatal:  Symmetrical without gross deformities. Neurologic:  Alert and oriented x4;  grossly normal neurologically. Psych:  Alert and cooperative. Normal mood and affect. Heme/Lymph/Immune: No excessive bruising noted.    04/15/2015 9:44 AM   Disclaimer: This note was dictated with voice recognition software. Similar sounding words can inadvertently be transcribed and may not be corrected upon review.

## 2015-04-15 NOTE — Assessment & Plan Note (Signed)
Noted red blood per rectum with occasional clots. History of hemorrhoids. Last colonoscopy 8 years ago. Given this and anemia we'll proceed with colonoscopy as noted below. Return for follow-up in 2 months.

## 2015-04-15 NOTE — Patient Instructions (Signed)
1. Have your labs drawn when you're able to. 2. We will schedule your procedures for you. 3. Keep taking Prilosec and iron. 4. Return for follow-up in 2 months.

## 2015-04-15 NOTE — Assessment & Plan Note (Signed)
Patient with admitted rectal bleeding, noted anemia in the emergency department. No red flag/warning signs or symptoms. Last colonoscopy 8 years ago. Today I'll check CBC, BMP, iron, ferritin. We will plan for further evaluation with colonoscopy and endoscopy to evaluate for anemia. If exams are normal, may need to consider capsule endoscopy as a next step. Return for follow-up in 2 months. Continue PPI and iron supplement per emergency room.   Proceed with TCS and EGD with Dr. Gala Romney in near future: the risks, benefits, and alternatives have been discussed with the patient in detail. The patient states understanding and desires to proceed.  The patient is not on any anticoagulants, anxiolytics, chronic pain medications, antidepressants. Denies alcohol and drug use.

## 2015-04-15 NOTE — Assessment & Plan Note (Signed)
History of GERD, generally well controlled on PPI. Recommend continue PPI as currently ordered. Return for follow-up in 2 months.

## 2015-04-16 DIAGNOSIS — K439 Ventral hernia without obstruction or gangrene: Secondary | ICD-10-CM | POA: Diagnosis not present

## 2015-04-16 LAB — CBC
HCT: 33 % — ABNORMAL LOW (ref 38.5–50.0)
HEMOGLOBIN: 10 g/dL — AB (ref 13.2–17.1)
MCH: 21.8 pg — AB (ref 27.0–33.0)
MCHC: 30.3 g/dL — ABNORMAL LOW (ref 32.0–36.0)
MCV: 72.1 fL — AB (ref 80.0–100.0)
MPV: 9.7 fL (ref 7.5–12.5)
Platelets: 298 10*3/uL (ref 140–400)
RBC: 4.58 MIL/uL (ref 4.20–5.80)
RDW: 18.5 % — AB (ref 11.0–15.0)
WBC: 8.6 10*3/uL (ref 3.8–10.8)

## 2015-04-17 LAB — IRON AND TIBC
%SAT: 73 % — AB (ref 15–60)
IRON: 342 ug/dL — AB (ref 50–180)
TIBC: 468 ug/dL — AB (ref 250–425)
UIBC: 126 ug/dL (ref 125–400)

## 2015-04-17 LAB — BASIC METABOLIC PANEL
BUN: 12 mg/dL (ref 7–25)
CALCIUM: 9.1 mg/dL (ref 8.6–10.3)
CO2: 25 mmol/L (ref 20–31)
CREATININE: 0.69 mg/dL — AB (ref 0.70–1.33)
Chloride: 104 mmol/L (ref 98–110)
Glucose, Bld: 81 mg/dL (ref 65–99)
POTASSIUM: 4.2 mmol/L (ref 3.5–5.3)
Sodium: 139 mmol/L (ref 135–146)

## 2015-04-17 LAB — FERRITIN: FERRITIN: 10 ng/mL — AB (ref 20–380)

## 2015-05-05 ENCOUNTER — Other Ambulatory Visit: Payer: Self-pay

## 2015-05-05 MED ORDER — PEG 3350-KCL-NA BICARB-NACL 420 G PO SOLR: 4000.0000 mL | Freq: Once | ORAL | Status: DC

## 2015-05-07 ENCOUNTER — Encounter (HOSPITAL_COMMUNITY): Payer: Self-pay | Admitting: *Deleted

## 2015-05-07 ENCOUNTER — Encounter (HOSPITAL_COMMUNITY)
Admission: RE | Disposition: A | Discharge status: Home / Self Care | Payer: Self-pay | Source: Ambulatory Visit | Attending: Internal Medicine

## 2015-05-07 ENCOUNTER — Ambulatory Visit (HOSPITAL_COMMUNITY)
Admission: RE | Admit: 2015-05-07 | Discharge: 2015-05-07 | Disposition: A | Discharge status: Home / Self Care | Payer: BLUE CROSS/BLUE SHIELD | Source: Ambulatory Visit | Attending: Internal Medicine | Admitting: Internal Medicine

## 2015-05-07 DIAGNOSIS — K2100 Gastro-esophageal reflux disease with esophagitis, without bleeding: Secondary | ICD-10-CM | POA: Insufficient documentation

## 2015-05-07 DIAGNOSIS — R109 Unspecified abdominal pain: Secondary | ICD-10-CM | POA: Insufficient documentation

## 2015-05-07 DIAGNOSIS — D123 Benign neoplasm of transverse colon: Secondary | ICD-10-CM

## 2015-05-07 DIAGNOSIS — K648 Other hemorrhoids: Secondary | ICD-10-CM | POA: Diagnosis not present

## 2015-05-07 DIAGNOSIS — K449 Diaphragmatic hernia without obstruction or gangrene: Secondary | ICD-10-CM | POA: Diagnosis not present

## 2015-05-07 DIAGNOSIS — Z79899 Other long term (current) drug therapy: Secondary | ICD-10-CM | POA: Insufficient documentation

## 2015-05-07 DIAGNOSIS — K921 Melena: Secondary | ICD-10-CM | POA: Diagnosis not present

## 2015-05-07 DIAGNOSIS — K219 Gastro-esophageal reflux disease without esophagitis: Secondary | ICD-10-CM | POA: Diagnosis present

## 2015-05-07 DIAGNOSIS — Z860100 Personal history of colon polyps, unspecified: Secondary | ICD-10-CM | POA: Insufficient documentation

## 2015-05-07 DIAGNOSIS — K640 First degree hemorrhoids: Secondary | ICD-10-CM | POA: Insufficient documentation

## 2015-05-07 DIAGNOSIS — R12 Heartburn: Secondary | ICD-10-CM | POA: Insufficient documentation

## 2015-05-07 DIAGNOSIS — D649 Anemia, unspecified: Secondary | ICD-10-CM

## 2015-05-07 HISTORY — PX: ESOPHAGOGASTRODUODENOSCOPY: SHX5428

## 2015-05-07 HISTORY — PX: COLONOSCOPY: SHX5424

## 2015-05-07 SURGERY — COLONOSCOPY
Anesthesia: Moderate Sedation

## 2015-05-07 MED ORDER — MEPERIDINE HCL 100 MG/ML IJ SOLN
INTRAMUSCULAR | Status: AC
  Filled 2015-05-07: qty 2

## 2015-05-07 MED ORDER — MIDAZOLAM HCL 5 MG/5ML IJ SOLN
INTRAMUSCULAR | Status: AC
  Filled 2015-05-07: qty 10

## 2015-05-07 MED ORDER — ONDANSETRON HCL 4 MG/2ML IJ SOLN
INTRAMUSCULAR | Status: AC
  Filled 2015-05-07: qty 2

## 2015-05-07 MED ORDER — MEPERIDINE HCL 100 MG/ML IJ SOLN
INTRAMUSCULAR | Status: DC | PRN
  Administered 2015-05-07 (×2): 25 mg via INTRAVENOUS
  Administered 2015-05-07: 50 mg via INTRAVENOUS

## 2015-05-07 MED ORDER — STERILE WATER FOR IRRIGATION IR SOLN
Status: DC | PRN
  Administered 2015-05-07: 09:00:00

## 2015-05-07 MED ORDER — MIDAZOLAM HCL 5 MG/5ML IJ SOLN
INTRAMUSCULAR | Status: DC | PRN
  Administered 2015-05-07 (×2): 1 mg via INTRAVENOUS
  Administered 2015-05-07 (×2): 2 mg via INTRAVENOUS
  Administered 2015-05-07: 1 mg via INTRAVENOUS

## 2015-05-07 MED ORDER — SODIUM CHLORIDE 0.9 % IV SOLN
INTRAVENOUS | Status: DC
  Administered 2015-05-07: 09:00:00 via INTRAVENOUS

## 2015-05-07 MED ORDER — ONDANSETRON HCL 4 MG/2ML IJ SOLN
INTRAMUSCULAR | Status: DC | PRN
  Administered 2015-05-07: 4 mg via INTRAVENOUS

## 2015-05-07 MED ORDER — LIDOCAINE VISCOUS 2 % MT SOLN
OROMUCOSAL | Status: AC
  Filled 2015-05-07: qty 15

## 2015-05-07 MED ORDER — LIDOCAINE VISCOUS 2 % MT SOLN
OROMUCOSAL | Status: DC | PRN
  Administered 2015-05-07: 3 mL via OROMUCOSAL

## 2015-05-07 NOTE — Op Note (Signed)
[facility] Patient Name: Alan Leblanc Procedure Date: 05/07/2015 8:50 AM MRN: [id number] Date of Birth: Feb 10, 1960 Attending MD: Norvel Richards , MD CSN: [id number] Age: 55 Admit Type: Outpatient Procedure:                Upper GI endoscopy - diagnostic?"long-standing GERD Indications:              Heartburn Providers:                Norvel Richards, MD, Gwenlyn Fudge, RN, Georgeann Oppenheim, Technician Referring MD:              Medicines:                Midazolam 7 mg IV, Meperidine 100 mg IV,                            Ondansetron 4 mg IV Complications:            No immediate complications. Estimated Blood Loss:     Estimated blood loss: none. Procedure:                Pre-Anesthesia Assessment:                           - Prior to the procedure, a History and Physical                            was performed, and patient medications and                            allergies were reviewed. The patient's tolerance of                            previous anesthesia was also reviewed. The risks                            and benefits of the procedure and the sedation                            options and risks were discussed with the patient.                            All questions were answered, and informed consent                            was obtained. Prior Anticoagulants: The patient has                            taken no previous anticoagulant or antiplatelet                            agents. ASA Grade Assessment: II - A patient with  mild systemic disease. After reviewing the risks                            and benefits, the patient was deemed in                            satisfactory condition to undergo the procedure.                           After obtaining informed consent, the endoscope was                            passed under direct vision. Throughout the                            procedure,  the patient's blood pressure, pulse, and                            oxygen saturations were monitored continuously. The                            [id number]) scope was introduced through the                            mouth, and advanced to the second part of duodenum.                            The upper GI endoscopy was accomplished without                            difficulty. The patient tolerated the procedure                            well. Scope In: 9:06:38 AM Scope Out: 9:10:45 AM Total Procedure Duration: 0 hours 4 minutes 7 seconds  Findings:      LA Grade A (one or more mucosal breaks less than 5 mm, not extending       between tops of 2 mucosal folds) esophagitis was found 35 to 36 cm from       the incisors. Estimated blood loss: none.      A small hiatal hernia was present. gastric mucosa normal otherwise..      duodenum to second portion appeared normal Impression:               - LA Grade A esophagitis.                           - Small hiatal hernia.                           - The examination was otherwise normal.                           - No specimens collected. Moderate Sedation:      Moderate (conscious) sedation was administered by the endoscopy nurse  and supervised by the endoscopist. The following parameters were       monitored: oxygen saturation, heart rate, blood pressure, respiratory       rate, EKG, adequacy of pulmonary ventilation, and response to care.       Total physician intraservice time was 38 minutes. Recommendation:           - Patient has a contact number available for                            emergencies. The signs and symptoms of potential                            delayed complications were discussed with the                            patient. Return to normal activities tomorrow.                            Written discharge instructions were provided to the                            patient.                           -  Resume previous diet.                           - Continue present medications.                           - Return to GI office in 2 months. Procedure Code(s):        --- Professional ---                           [phone], Esophagogastroduodenoscopy, flexible,                            transoral; diagnostic, including collection of                            specimen(s) by brushing or washing, when performed                            (separate procedure)                           99152, Moderate sedation services provided by the                            same physician or other qualified health care                            professional performing the diagnostic or                            therapeutic service that the sedation supports,  requiring the presence of an independent trained                            observer to assist in the monitoring of the                            patient's level of consciousness and physiological                            status; initial 15 minutes of intraservice time,                            patient age 92 years or older                           [id number], Moderate sedation services; each additional                            15 minutes intraservice time                           99153, Moderate sedation services; each additional                            15 minutes intraservice time Diagnosis Code(s):        --- Professional ---                           K20.9, Esophagitis, unspecified                           K44.9, Diaphragmatic hernia without obstruction or                            gangrene                           R12, Heartburn CPT copyright 2016 American Medical Association. All rights reserved. The codes documented in this report are preliminary and upon coder review may  be revised to meet current compliance requirements. Cristopher Estimable. Juliani Laduke, MD Norvel Richards, MD 05/07/2015 9:43:00 AM This report has  been signed electronically. Number of Addenda: 0

## 2015-05-07 NOTE — Op Note (Signed)
[facility] Patient Name: Alan Leblanc Procedure Date: 05/07/2015 9:13 AM MRN: [id number] Date of Birth: May 24, 1960 Attending MD: Norvel Richards , MD CSN: [id number] Age: 55 Admit Type: Outpatient Procedure:                Colonoscopy with snare polypectomy Indications:              Hematochezia Providers:                Norvel Richards, MD, Gwenlyn Fudge, RN, Georgeann Oppenheim, Technician Referring MD:              Medicines:                Midazolam 7 mg IV, Meperidine 100 mg IV,                            Ondansetron 4 mg IV Complications:            No immediate complications. Estimated Blood Loss:     Estimated blood loss was minimal. Procedure:                Pre-Anesthesia Assessment:                           - Prior to the procedure, a History and Physical                            was performed, and patient medications and                            allergies were reviewed. The patient's tolerance of                            previous anesthesia was also reviewed. The risks                            and benefits of the procedure and the sedation                            options and risks were discussed with the patient.                            All questions were answered, and informed consent                            was obtained. Prior Anticoagulants: The patient has                            taken no previous anticoagulant or antiplatelet                            agents. ASA Grade Assessment: II - A patient with  mild systemic disease. After reviewing the risks                            and benefits, the patient was deemed in                            satisfactory condition to undergo the procedure.                           - Prior to the procedure, a History and Physical                            was performed, and patient medications and                            allergies were reviewed.  The patient's tolerance of                            previous anesthesia was also reviewed. The risks                            and benefits of the procedure and the sedation                            options and risks were discussed with the patient.                            All questions were answered, and informed consent                            was obtained. Prior Anticoagulants: The patient has                            taken no previous anticoagulant or antiplatelet                            agents. ASA Grade Assessment: II - A patient with                            mild systemic disease. After reviewing the risks                            and benefits, the patient was deemed in                            satisfactory condition to undergo the procedure.                           After obtaining informed consent, the colonoscope                            was passed under direct vision. Throughout the  procedure, the patient's blood pressure, pulse, and                            oxygen saturations were monitored continuously. The                            [id number] scope was introduced through                            the anus and advanced to the the cecum, identified                            by appendiceal orifice and ileocecal valve. The                            entire colon was examined. The colonoscopy was                            performed without difficulty. The patient tolerated                            the procedure well. The quality of the bowel                            preparation was adequate. The ileocecal valve,                            appendiceal orifice, and rectum were photographed. Scope In: 9:15:39 AM Scope Out: 9:33:26 AM Scope Withdrawal Time: 0 hours 10 minutes 20 seconds  Total Procedure Duration: 0 hours 17 minutes 47 seconds  Findings:      The perianal and digital rectal examinations were  normal.      A polyp was wound at the splenic flexureThe polyp was sessile. The polyp       was removed with a cold snare. Resection and retrieval were complete.       Estimated blood loss was minimal.      The exam was otherwise without abnormality.      Non-bleeding internal hemorrhoids were found during retroflexion. The       hemorrhoids were Grade I (internal hemorrhoids that do not prolapse). Impression:               - One polyp in the splenic flexure, removed with a                            cold snare. Resected and retrieved.                           - The examination was otherwise normal.                           - Non-bleeding internal hemorrhoids. Moderate Sedation:      Moderate (conscious) sedation was administered by the endoscopy nurse       and supervised by the endoscopist. The following parameters were       monitored: oxygen saturation, heart  rate, blood pressure, respiratory       rate, EKG, adequacy of pulmonary ventilation, and response to care.       Total physician intraservice time was 38 minutes. Recommendation:           - Patient has a contact number available for                            emergencies. The signs and symptoms of potential                            delayed complications were discussed with the                            patient. Return to normal activities tomorrow.                            Written discharge instructions were provided to the                            patient.                           - Resume previous diet.                           - Continue present medications.                           - Await pathology results.                           - Repeat colonoscopy date to be determined after                            pending pathology results are reviewed for                            surveillance based on pathology results.                           - Return to GI office in 2 months. Procedure Code(s):        ---  Professional ---                           [id number], Colonoscopy, flexible; with removal of                            tumor(s), polyp(s), or other lesion(s) by snare                            technique                           99152, Moderate sedation services provided by the  same physician or other qualified health care                            professional performing the diagnostic or                            therapeutic service that the sedation supports,                            requiring the presence of an independent trained                            observer to assist in the monitoring of the                            patient's level of consciousness and physiological                            status; initial 15 minutes of intraservice time,                            patient age 90 years or older                           [phone], Moderate sedation services; each additional                            15 minutes intraservice time                           99153, Moderate sedation services; each additional                            15 minutes intraservice time Diagnosis Code(s):        --- Professional ---                           K62.1, Rectal polyp                           K64.0, First degree hemorrhoids                           K92.1, Melena (includes Hematochezia) CPT copyright 2016 [employer] Association. All rights reserved. The codes documented in this report are preliminary and upon coder review may  be revised to meet current compliance requirements. Cristopher Estimable. Denton Derks, MD Norvel Richards, MD 05/07/2015 9:48:47 AM This report has been signed electronically. Number of Addenda: 0

## 2015-05-07 NOTE — Discharge Instructions (Signed)
Colonoscopy Discharge Instructions  Read the instructions outlined below and refer to this sheet in the next few weeks. These discharge instructions provide you with general information on caring for yourself after you leave the hospital. Your doctor may also give you specific instructions. While your treatment has been planned according to the most current medical practices available, unavoidable complications occasionally occur. If you have any problems or questions after discharge, call Dr. Gala Romney at [phone]. ACTIVITY  You may resume your regular activity, but move at a slower pace for the next 24 hours.   Take frequent rest periods for the next 24 hours.   Walking will help get rid of the air and reduce the bloated feeling in your belly (abdomen).   No driving for 24 hours (because of the medicine (anesthesia) used during the test).    Do not sign any important legal documents or operate any machinery for 24 hours (because of the anesthesia used during the test).  NUTRITION  Drink plenty of fluids.   You may resume your normal diet as instructed by your doctor.   Begin with a light meal and progress to your normal diet. Heavy or fried foods are harder to digest and may make you feel sick to your stomach (nauseated).   Avoid alcoholic beverages for 24 hours or as instructed.  MEDICATIONS  You may resume your normal medications unless your doctor tells you otherwise.  WHAT YOU CAN EXPECT TODAY  Some feelings of bloating in the abdomen.   Passage of more gas than usual.   Spotting of blood in your stool or on the toilet paper.  IF YOU HAD POLYPS REMOVED DURING THE COLONOSCOPY:  No aspirin products for 7 days or as instructed.   No alcohol for 7 days or as instructed.   Eat a soft diet for the next 24 hours.  FINDING OUT THE RESULTS OF YOUR TEST Not all test results are available during your visit. If your test results are not back during the visit, make an appointment  with your caregiver to find out the results. Do not assume everything is normal if you have not heard from your caregiver or the medical facility. It is important for you to follow up on all of your test results.  SEEK IMMEDIATE MEDICAL ATTENTION IF:  You have more than a spotting of blood in your stool.   Your belly is swollen (abdominal distention).   You are nauseated or vomiting.   You have a temperature over 101.  You have abdominal pain or discomfort that is severe or gets worse throughout the day.  EGD Discharge instructions Please read the instructions outlined below and refer to this sheet in the next few weeks. These discharge instructions provide you with general information on caring for yourself after you leave the hospital. Your doctor may also give you specific instructions. While your treatment has been planned according to the most current medical practices available, unavoidable complications occasionally occur. If you have any problems or questions after discharge, please call your doctor. ACTIVITY You may resume your regular activity but move at a slower pace for the next 24 hours.  Take frequent rest periods for the next 24 hours.  Walking will help expel (get rid of) the air and reduce the bloated feeling in your abdomen.  No driving for 24 hours (because of the anesthesia (medicine) used during the test).  You may shower.  Do not sign any important legal documents or operate any machinery for  24 hours (because of the anesthesia used during the test).  NUTRITION Drink plenty of fluids.  You may resume your normal diet.  Begin with a light meal and progress to your normal diet.  Avoid alcoholic beverages for 24 hours or as instructed by your caregiver.  MEDICATIONS You may resume your normal medications unless your caregiver tells you otherwise.  WHAT YOU CAN EXPECT TODAY You may experience abdominal discomfort such as a feeling of fullness or gas pains.    FOLLOW-UP Your doctor will discuss the results of your test with you.  SEEK IMMEDIATE MEDICAL ATTENTION IF ANY OF THE FOLLOWING OCCUR: Excessive nausea (feeling sick to your stomach) and/or vomiting.  Severe abdominal pain and distention (swelling).  Trouble swallowing.  Temperature over 101 F (37.8 C).  Rectal bleeding or vomiting of blood.     GERD and colon polyp information provided  Continue Prilosec 20 mg twice daily  Further recommendations to follow pending review of pathology report  Keep office visit scheduled for about 2 months from now  Gastroesophageal Reflux Disease, Adult Normally, food travels down the esophagus and stays in the stomach to be digested. However, when a person has gastroesophageal reflux disease (GERD), food and stomach acid move back up into the esophagus. When this happens, the esophagus becomes sore and inflamed. Over time, GERD can create small holes (ulcers) in the lining of the esophagus.  CAUSES This condition is caused by a problem with the muscle between the esophagus and the stomach (lower esophageal sphincter, or LES). Normally, the LES muscle closes after food passes through the esophagus to the stomach. When the LES is weakened or abnormal, it does not close properly, and that allows food and stomach acid to go back up into the esophagus. The LES can be weakened by certain dietary substances, medicines, and medical conditions, including:  Tobacco use.  Pregnancy.  Having a hiatal hernia.  Heavy alcohol use.  Certain foods and beverages, such as coffee, chocolate, onions, and peppermint. RISK FACTORS This condition is more likely to develop in:  People who have an increased body weight.  People who have connective tissue disorders.  People who use NSAID medicines. SYMPTOMS Symptoms of this condition include:  Heartburn.  Difficult or painful swallowing.  The feeling of having a lump in the throat.  Abitter taste in the  mouth.  Bad breath.  Having a large amount of saliva.  Having an upset or bloated stomach.  Belching.  Chest pain.  Shortness of breath or wheezing.  Ongoing (chronic) cough or a night-time cough.  Wearing away of tooth enamel.  Weight loss. Different conditions can cause chest pain. Make sure to see your health care provider if you experience chest pain. DIAGNOSIS Your health care provider will take a medical history and perform a physical exam. To determine if you have mild or severe GERD, your health care provider may also monitor how you respond to treatment. You may also have other tests, including:  An endoscopy toexamine your stomach and esophagus with a small camera.  A test thatmeasures the acidity level in your esophagus.  A test thatmeasures how much pressure is on your esophagus.  A barium swallow or modified barium swallow to show the shape, size, and functioning of your esophagus. TREATMENT The goal of treatment is to help relieve your symptoms and to prevent complications. Treatment for this condition may vary depending on how severe your symptoms are. Your health care provider may recommend:  Changes to your  diet.  Medicine.  Surgery. HOME CARE INSTRUCTIONS Diet  Follow a diet as recommended by your health care provider. This may involve avoiding foods and drinks such as:  Coffee and tea (with or without caffeine).  Drinks that containalcohol.  Energy drinks and sports drinks.  Carbonated drinks or sodas.  Chocolate and cocoa.  Peppermint and mint flavorings.  Garlic and onions.  Horseradish.  Spicy and acidic foods, including peppers, chili powder, curry powder, vinegar, hot sauces, and barbecue sauce.  Citrus fruit juices and citrus fruits, such as oranges, lemons, and limes.  Tomato-based foods, such as red sauce, chili, salsa, and pizza with red sauce.  Fried and fatty foods, such as donuts, french fries, potato chips, and  high-fat dressings.  High-fat meats, such as hot dogs and fatty cuts of red and white meats, such as rib eye steak, sausage, ham, and bacon.  High-fat dairy items, such as whole milk, butter, and cream cheese.  Eat small, frequent meals instead of large meals.  Avoid drinking large amounts of liquid with your meals.  Avoid eating meals during the 2-3 hours before bedtime.  Avoid lying down right after you eat.  Do not exercise right after you eat. General Instructions  Pay attention to any changes in your symptoms.  Take over-the-counter and prescription medicines only as told by your health care provider. Do not take aspirin, ibuprofen, or other NSAIDs unless your health care provider told you to do so.  Do not use any tobacco products, including cigarettes, chewing tobacco, and e-cigarettes. If you need help quitting, ask your health care provider.  Wear loose-fitting clothing. Do not wear anything tight around your waist that causes pressure on your abdomen.  Raise (elevate) the head of your bed 6 inches (15cm).  Try to reduce your stress, such as with yoga or meditation. If you need help reducing stress, ask your health care provider.  If you are overweight, reduce your weight to an amount that is healthy for you. Ask your health care provider for guidance about a safe weight loss goal.  Keep all follow-up visits as told by your health care provider. This is important. SEEK MEDICAL CARE IF:  You have new symptoms.  You have unexplained weight loss.  You have difficulty swallowing, or it hurts to swallow.  You have wheezing or a persistent cough.  Your symptoms do not improve with treatment.  You have a hoarse voice. SEEK IMMEDIATE MEDICAL CARE IF:  You have pain in your arms, neck, jaw, teeth, or back.  You feel sweaty, dizzy, or light-headed.  You have chest pain or shortness of breath.  You vomit and your vomit looks like blood or coffee grounds.  You  faint.  Your stool is bloody or black.  You cannot swallow, drink, or eat.   This information is not intended to replace advice given to you by your health care provider. Make sure you discuss any questions you have with your health care provider.   Document Released: 10/06/2004 Document Revised: 09/17/2014 Document Reviewed: 04/23/2014 Elsevier Interactive Patient Education 2016 [employer] Colon Polyps Polyps are lumps of extra tissue growing inside the body. Polyps can grow in the large intestine (colon). Most colon polyps are noncancerous (benign). However, some colon polyps can become cancerous over time. Polyps that are larger than a pea may be harmful. To be safe, caregivers remove and test all polyps. CAUSES  Polyps form when mutations in the genes cause your cells to grow and divide even  though no more tissue is needed. RISK FACTORS There are a number of risk factors that can increase your chances of getting colon polyps. They include:  Being older than 50 years.  Family history of colon polyps or colon cancer.  Long-term colon diseases, such as colitis or Crohn disease.  Being overweight.  Smoking.  Being inactive.  Drinking too much alcohol. SYMPTOMS  Most small polyps do not cause symptoms. If symptoms are present, they may include:  Blood in the stool. The stool may look dark red or black.  Constipation or diarrhea that lasts longer than 1 week. DIAGNOSIS People often do not know they have polyps until their caregiver finds them during a regular checkup. Your caregiver can use 4 tests to check for polyps:  Digital rectal exam. The caregiver wears gloves and feels inside the rectum. This test would find polyps only in the rectum.  Barium enema. The caregiver puts a liquid called barium into your rectum before taking X-rays of your colon. Barium makes your colon look white. Polyps are dark, so they are easy to see in the X-ray pictures.  Sigmoidoscopy. A thin,  flexible tube (sigmoidoscope) is placed into your rectum. The sigmoidoscope has a light and tiny camera in it. The caregiver uses the sigmoidoscope to look at the last third of your colon.  Colonoscopy. This test is like sigmoidoscopy, but the caregiver looks at the entire colon. This is the most common method for finding and removing polyps. TREATMENT  Any polyps will be removed during a sigmoidoscopy or colonoscopy. The polyps are then tested for cancer. PREVENTION  To help lower your risk of getting more colon polyps:  Eat plenty of fruits and vegetables. Avoid eating fatty foods.  Do not smoke.  Avoid drinking alcohol.  Exercise every day.  Lose weight if recommended by your caregiver.  Eat plenty of calcium and folate. Foods that are rich in calcium include milk, cheese, and broccoli. Foods that are rich in folate include chickpeas, kidney beans, and spinach. HOME CARE INSTRUCTIONS Keep all follow-up appointments as directed by your caregiver. You may need periodic exams to check for polyps. SEEK MEDICAL CARE IF: You notice bleeding during a bowel movement.   This information is not intended to replace advice given to you by your health care provider. Make sure you discuss any questions you have with your health care provider.   Document Released: 09/23/2003 Document Revised: 01/17/2014 Document Reviewed: 03/08/2011 Elsevier Interactive Patient Education [employer].

## 2015-05-07 NOTE — H&P (View-Only) (Signed)
Primary Care Physician:  No PCP Per Patient Primary Gastroenterologist:  Dr. Gala Romney  Chief Complaint  Patient presents with  . Anemia  . Gastroesophageal Reflux    HPI:   Alan Leblanc is a 55 y.o. male who presents on referral from the emergency department. At that time he admitted a known abdominal hernia in his upper abdomen which occasionally pops out and has been occurring more frequently lately. Also burning sensation in his epigastric area. Takes Prilosec which is fairly well controlled. Denied any bleeding or melena. States normal colonoscopy 2 years ago, never had upper endoscopy. Soft ventral hernia noted without red flag/warning signs or symptoms on exam. He was noted to be anemic with a hemoglobin of 9.5. A referral to surgery was made regarding the ventral hernia and he was started on iron by the emergency room physician. Last colonoscopy found in our system dated 10/17/2007 which found a 2 mm rectal polyp found to be polypoid colonic mucosa was benign lymphoid aggregate and without adenomatous change. No EGD history in our system.  Today he states his last colonoscopy was in 2009. Has never had an EGD. He has had esophageal burning and left-sided anterior abdominal pain. This pain has resolved. Is still having reflux, is on Prilosec. Minimal breakthrough GERD symptoms when on PPI. Admits hematochezia with occasional clots, has a history of hemorrhoids. Denies melena. Has a bowel movement daily with straining but stools are not hard. No on anything for constipation. Denies N/V, unintentional weight loss, fever, chills. Denies chest pain, dyspnea, dizziness, lightheadedness, syncope, near syncope. Denies any other upper or lower GI symptoms. Avoids NSAIDS and ASA powders.  Past Medical History  Diagnosis Date  . GERD (gastroesophageal reflux disease)   . Cholecystitis   . Anemia     Past Surgical History  Procedure Laterality Date  . Appendectomy    . Cholecystectomy    .  Leg surgery      right    Current Outpatient Prescriptions  Medication Sig Dispense Refill  . ferrous sulfate 325 (65 FE) MG tablet Take 1 tablet (325 mg total) by mouth 2 (two) times daily with a meal. 60 tablet 3  . omeprazole (PRILOSEC) 20 MG capsule Take 1 capsule (20 mg total) by mouth 2 (two) times daily. 60 capsule 1   No current facility-administered medications for this visit.    Allergies as of 04/15/2015 - Review Complete 04/15/2015  Allergen Reaction Noted  . Penicillins Other (See Comments)     Family History  Problem Relation Age of Onset  . Osteoarthritis Mother   . Tuberculosis Father   . Colon cancer Neg Hx     Limited knowledge of family history    Social History   Social History  . Marital Status: Married    Spouse Name: N/A  . Number of Children: N/A  . Years of Education: N/A   Occupational History  . Not on file.   Social History Main Topics  . Smoking status: Never Smoker   . Smokeless tobacco: Never Used  . Alcohol Use: No  . Drug Use: No  . Sexual Activity: Not on file   Other Topics Concern  . Not on file   Social History Narrative    Review of Systems: General: Negative for anorexia, weight loss, fever, chills, fatigue, weakness. Eyes: Negative for vision changes.  ENT: Negative for hoarseness, difficulty swallowing. CV: Negative for chest pain, angina, palpitations,peripheral edema.  Respiratory: Negative for dyspnea at rest, cough,  sputum, wheezing.  GI: See history of present illness.  Derm: Negative for rash or itching.  Endo: Negative for unusual weight change.  Heme: Negative for bruising or bleeding. Allergy: Negative for rash or hives.    Physical Exam: BP 133/83 mmHg  Pulse 57  Temp(Src) 97.2 F (36.2 C) (Oral)  Ht 5\' 6"  (1.676 m)  Wt 216 lb 9.6 oz (98.249 kg)  BMI 34.98 kg/m2 General:   Alert and oriented. Pleasant and cooperative. Well-nourished and well-developed.  Head:  Normocephalic and  atraumatic. Eyes:  Without icterus, sclera clear and conjunctiva pink.  Ears:  Normal auditory acuity. Cardiovascular:  S1, S2 present without murmurs appreciated. Extremities without clubbing or edema. Respiratory:  Clear to auscultation bilaterally. No wheezes, rales, or rhonchi. No distress.  Gastrointestinal:  +BS, soft, non-tender and non-distended. No guarding or rebound. Rectal:  Deferred  Musculoskalatal:  Symmetrical without gross deformities. Neurologic:  Alert and oriented x4;  grossly normal neurologically. Psych:  Alert and cooperative. Normal mood and affect. Heme/Lymph/Immune: No excessive bruising noted.    04/15/2015 9:44 AM   Disclaimer: This note was dictated with voice recognition software. Similar sounding words can inadvertently be transcribed and may not be corrected upon review.

## 2015-05-07 NOTE — Interval H&P Note (Signed)
History and Physical Interval Note:  05/07/2015 8:53 AM  [place]  has presented today for surgery, with the diagnosis of GERD, anemia, hematochezia  The various methods of treatment have been discussed with the patient and family. After consideration of risks, benefits and other options for treatment, the patient has consented to  Procedure(s) with comments: COLONOSCOPY (N/A) - 0900 ESOPHAGOGASTRODUODENOSCOPY (EGD) (N/A) as a surgical intervention .  The patient's history has been reviewed, patient examined, no change in status, stable for surgery.  I have reviewed the patient's chart and labs.  Questions were answered to the patient's satisfaction.     Robert Rourk  No change. Patient denies dysphagia. Diagnostic EGD and colonoscopy per plan.  The risks, benefits, limitations, imponderables and alternatives regarding both EGD and colonoscopy have been reviewed with the patient. Questions have been answered. All parties agreeable.

## 2015-05-08 ENCOUNTER — Encounter: Payer: Self-pay | Admitting: Internal Medicine

## 2015-05-13 ENCOUNTER — Encounter (HOSPITAL_COMMUNITY): Payer: Self-pay | Admitting: Internal Medicine

## 2015-06-19 ENCOUNTER — Ambulatory Visit (INDEPENDENT_AMBULATORY_CARE_PROVIDER_SITE_OTHER): Payer: BLUE CROSS/BLUE SHIELD | Admitting: Nurse Practitioner

## 2015-06-19 ENCOUNTER — Other Ambulatory Visit: Payer: Self-pay

## 2015-06-19 ENCOUNTER — Encounter: Payer: Self-pay | Admitting: Nurse Practitioner

## 2015-06-19 VITALS — BP 123/72 | HR 61 | Temp 97.5°F | Ht 66.0 in | Wt 213.8 lb

## 2015-06-19 DIAGNOSIS — D649 Anemia, unspecified: Secondary | ICD-10-CM

## 2015-06-19 DIAGNOSIS — K2100 Gastro-esophageal reflux disease with esophagitis, without bleeding: Secondary | ICD-10-CM

## 2015-06-19 DIAGNOSIS — K921 Melena: Secondary | ICD-10-CM

## 2015-06-19 LAB — CBC WITH DIFFERENTIAL/PLATELET
BASOS ABS: 81 {cells}/uL (ref 0–200)
Basophils Relative: 1 %
EOS ABS: 243 {cells}/uL (ref 15–500)
Eosinophils Relative: 3 %
HEMATOCRIT: 32.8 % — AB (ref 38.5–50.0)
HEMOGLOBIN: 10.2 g/dL — AB (ref 13.2–17.1)
LYMPHS ABS: 2592 {cells}/uL (ref 850–3900)
LYMPHS PCT: 32 %
MCH: 23 pg — AB (ref 27.0–33.0)
MCHC: 31.1 g/dL — AB (ref 32.0–36.0)
MCV: 73.9 fL — AB (ref 80.0–100.0)
MONO ABS: 810 {cells}/uL (ref 200–950)
MPV: 9.6 fL (ref 7.5–12.5)
Monocytes Relative: 10 %
NEUTROS PCT: 54 %
Neutro Abs: 4374 cells/uL (ref 1500–7800)
Platelets: 276 10*3/uL (ref 140–400)
RBC: 4.44 MIL/uL (ref 4.20–5.80)
RDW: 19.7 % — AB (ref 11.0–15.0)
WBC: 8.1 10*3/uL (ref 3.8–10.8)

## 2015-06-19 MED ORDER — PANTOPRAZOLE SODIUM 40 MG PO TBEC: 40.0000 mg | DELAYED_RELEASE_TABLET | Freq: Every day | ORAL | Status: DC

## 2015-06-19 NOTE — Assessment & Plan Note (Deleted)
Reflux esophagitis on upper endoscopy, continues to be symptomatic with his GERD symptoms. I will have him stop omeprazole, start Protonix 40 mg daily. Return for follow-up in 3 months to further evaluate symptom progression.

## 2015-06-19 NOTE — Patient Instructions (Signed)
1. Start taking Colace stool softener every day. 2. Have your blood work drawn when you're able to. 3. Stop taking omeprazole (Prilosec) is. 4. Start taking Protonix 40 mg, take 1 a day on an empty stomach preferably 30 minutes before your first meal the day. 5. We will refer you to hematology for evaluation of your anemia. 6. Return for follow-up in 3 months.

## 2015-06-19 NOTE — Assessment & Plan Note (Signed)
Reflux esophagitis on upper endoscopy, continues to be symptomatic with his GERD symptoms. I will have him stop omeprazole, start Protonix 40 mg daily. Return for follow-up in 3 months to further evaluate symptom progression.

## 2015-06-19 NOTE — Progress Notes (Signed)
Referring Provider: No ref. provider found Primary Care Physician:  No PCP Per Patient Primary GI:  Dr. Gala Romney  Chief Complaint  Patient presents with  . Follow-up    still having some abd pain    HPI:   Alan Leblanc is a 55 y.o. male who presents for postprocedure follow-up. The patient was last seen in the office 04/15/15 for GERD, anemia, hematochezia. At that time noted long-standing history of GERD, well controlled on PPI. Hematochezia with noted anemia in the emergency department with hemoglobin 9.5. Has a ventral hernia and was referred to surgery although unsure if this evaluation is been completed. Last colonoscopy 8 years prior to office visit noted 2 mm rectal polyp noted to be polypoid colonic mucosa on surgical pathology. Labs ordered today include CBC, CMP, iron studies, ferritin. CBC noted improved hemoglobin at 10.0, MCV 72.1, normal platelets. CMP noted normal. Iron percent sat height 73%, high TIBC at 468, ferritin low at 10.  He was referred for endoscopy and colonoscopy to further evaluate rectal bleeding, long-standing GERD, anemia. EGD and colonoscopy were completed on 05/07/2015. EGD findings included LA grade A esophagitis, small hiatal hernia, otherwise normal. Colonoscopy found grade 1 internal hemorrhoids that do not prolapse, one polyp in the splenic flexure was removed moved and sent to surgical pathology, otherwise normal. Polyp was found to be tubular adenoma. Recommended continue previous diet, continue medications, repeat colonoscopy in 7 years (2024).  Today he states he's doing well overall. Saw a surgeon related to the hernia, usually soft and occasional more firm. Is wanting to consider surgical intervention in the Winter when his job is less hectic. Occasional discomfort in his esophagus, bitter taste in his mouth. Denies other abdominal pain, N/V. Continued occasional hematochezia after most bowel movements. Stools can be hard somewhat frequently. Is not  taking iron anymore. Denies chest pain, dyspnea, dizziness, lightheadedness, syncope, near syncope. Denies any other upper or lower GI symptoms.  Past Medical History  Diagnosis Date  . GERD (gastroesophageal reflux disease)   . Cholecystitis   . Anemia     Past Surgical History  Procedure Laterality Date  . Appendectomy    . Cholecystectomy    . Leg surgery      right  . Colonoscopy    . Colonoscopy N/A 05/07/2015    Procedure: COLONOSCOPY;  Surgeon: Daneil Dolin, MD;  Location: [facility];  Service: Endoscopy;  Laterality: N/A;  0900  . Esophagogastroduodenoscopy N/A 05/07/2015    Procedure: ESOPHAGOGASTRODUODENOSCOPY (EGD);  Surgeon: Daneil Dolin, MD;  Location: [facility];  Service: Endoscopy;  Laterality: N/A;    Current Outpatient Prescriptions  Medication Sig Dispense Refill  . omeprazole (PRILOSEC) 20 MG capsule Take 1 capsule (20 mg total) by mouth 2 (two) times daily. (Patient taking differently: Take 20 mg by mouth daily. ) 60 capsule 1  . pantoprazole (PROTONIX) 40 MG tablet Take 1 tablet (40 mg total) by mouth daily. 30 tablet 2   No current facility-administered medications for this visit.    Allergies as of 06/19/2015 - Review Complete 06/19/2015  Allergen Reaction Noted  . Penicillins Other (See Comments)     Family History  Problem Relation Age of Onset  . Osteoarthritis Mother   . Tuberculosis Father   . Colon cancer Neg Hx     Limited knowledge of family history    Social History   Social History  . Marital Status: Married    Spouse Name: N/A  .  Number of Children: N/A  . Years of Education: N/A   Social History Main Topics  . Smoking status: Never Smoker   . Smokeless tobacco: Never Used  . Alcohol Use: No  . Drug Use: No  . Sexual Activity: Not Asked   Other Topics Concern  . None   Social History Narrative    Review of Systems: General: Negative for anorexia, weight loss, fever, chills, fatigue, weakness. ENT: Negative  for hoarseness, difficulty swallowing. CV: Negative for chest pain, angina, palpitations, peripheral edema.  Respiratory: Negative for dyspnea at rest, cough, sputum, wheezing.  GI: See history of present illness. Endo: Negative for unusual weight change.    Physical Exam: BP 123/72 mmHg  Pulse 61  Temp(Src) 97.5 F (36.4 C) (Oral)  Ht 5\' 6"  (1.676 m)  Wt 213 lb 12.8 oz (96.979 kg)  BMI 34.52 kg/m2 General:   Alert and oriented. Pleasant and cooperative. Well-nourished and well-developed.  Ears:  Normal auditory acuity. Cardiovascular:  S1, S2 present without murmurs appreciated. Extremities without clubbing or edema. Respiratory:  Clear to auscultation bilaterally. No wheezes, rales, or rhonchi. No distress.  Gastrointestinal:  +BS, rounded but soft, non-tender and non-distended. No HSM noted. No guarding or rebound. No masses appreciated. Mid-abdominal ventral hernia noted, soft and minimally TTP. Rectal:  Deferred  Musculoskalatal:  Symmetrical without gross deformities. Neurologic:  Alert and oriented x4;  grossly normal neurologically. Psych:  Alert and cooperative. Normal mood and affect. Heme/Lymph/Immune: No excessive bruising noted.    06/19/2015 2:14 PM   Disclaimer: This note was dictated with voice recognition software. Similar sounding words can inadvertently be transcribed and may not be corrected upon review.

## 2015-06-19 NOTE — Assessment & Plan Note (Signed)
Continued anemia at last office visit. Hemoglobin was improved at 10.0, ferritin low, TIBC high, but iron saturations also high. At this point I'll recheck CBC and referred to hematology for anemia management. Return for follow-up in 3 months. Depending on status of anemia today, can consider outpatient capsule endoscopy to complete workup for anemia.

## 2015-06-19 NOTE — Assessment & Plan Note (Addendum)
Continued hematochezia, likely due to internal hemorrhoids as noted on colonoscopy. Continue to monitor. Return for follow-up in 3 months, if they continue to be bothersome we can discuss possible outpatient hemorrhoid banding if he desires. We'll also have him start daily stool softener to promote soft bowel movements.

## 2015-06-22 NOTE — Progress Notes (Signed)
No pcp per pt °

## 2015-06-23 NOTE — Progress Notes (Signed)
Quick Note:  Pt is aware and OK to schedule the capsule endoscopy. ______

## 2015-06-23 NOTE — Progress Notes (Signed)
Quick Note:  LMOM to call. ______ 

## 2015-06-26 ENCOUNTER — Other Ambulatory Visit: Payer: Self-pay

## 2015-06-26 DIAGNOSIS — D649 Anemia, unspecified: Secondary | ICD-10-CM

## 2015-06-29 ENCOUNTER — Ambulatory Visit (HOSPITAL_COMMUNITY): Payer: BLUE CROSS/BLUE SHIELD

## 2015-07-08 ENCOUNTER — Ambulatory Visit (HOSPITAL_COMMUNITY)
Admission: RE | Admit: 2015-07-08 | Payer: BLUE CROSS/BLUE SHIELD | Source: Ambulatory Visit | Admitting: Internal Medicine

## 2015-07-08 ENCOUNTER — Encounter (HOSPITAL_COMMUNITY): Admission: RE | Payer: Self-pay | Source: Ambulatory Visit

## 2015-07-08 SURGERY — IMAGING PROCEDURE, GI TRACT, INTRALUMINAL, VIA CAPSULE

## 2015-07-28 ENCOUNTER — Ambulatory Visit (HOSPITAL_COMMUNITY): Payer: BLUE CROSS/BLUE SHIELD | Admitting: Oncology

## 2015-07-28 NOTE — Progress Notes (Signed)
NO SHOW

## 2015-09-22 ENCOUNTER — Encounter: Payer: Self-pay | Admitting: Nurse Practitioner

## 2015-09-22 ENCOUNTER — Ambulatory Visit (INDEPENDENT_AMBULATORY_CARE_PROVIDER_SITE_OTHER): Payer: BLUE CROSS/BLUE SHIELD | Admitting: Nurse Practitioner

## 2015-09-22 VITALS — BP 121/77 | HR 62 | Temp 97.6°F | Ht 66.0 in | Wt 217.0 lb

## 2015-09-22 DIAGNOSIS — K5900 Constipation, unspecified: Secondary | ICD-10-CM | POA: Diagnosis not present

## 2015-09-22 DIAGNOSIS — K2100 Gastro-esophageal reflux disease with esophagitis, without bleeding: Secondary | ICD-10-CM

## 2015-09-22 DIAGNOSIS — K921 Melena: Secondary | ICD-10-CM | POA: Diagnosis not present

## 2015-09-22 NOTE — Progress Notes (Signed)
Referring Provider: No ref. provider found Primary Care Physician:  No PCP Per Patient Primary GI:  Dr. Gala Romney  Chief Complaint  Patient presents with  . Follow-up    doing ok    HPI:   Alan Leblanc is a 55 y.o. male who presents follow-up on anemia and GERD. The patient was last seen in our office 06/19/2015 at which point he was doing well overall, had recently seen a surgeon related to his hernia and would entertain surgical evaluation in the winter when his job was less hectic. Occasional esophageal discomfort, bitter taste. Occasional hematochezia after most bowel movements with somewhat frequent constipation. Not taking iron any more. Recent EGD with grade a esophagitis, small hiatal hernia otherwise normal and colonoscopy with grade 1 internal hemorrhoids, 1 polyp found to be tubular adenoma. Recommend repeat in 7 years, currently up-to-date.  Continued hematochezia likely due to internal hemorrhoids in the setting of constipation. Recommend start daily stool softener. Persistent reflux esophagitis and GERD symptoms, recommended stop omeprazole start Protonix 40 mg daily. Return for follow-up in 3 months. Continued anemia, CBC recheck an referral to hematology made. Persistent anemia noted and he was referred for capsule endoscopy. Capsule endoscopy is noted to be canceled, patient was a no-show to hematology.  Today he states his GERD is much improved. Occasional breakthrough GERD about once a week. Is not taking Protonix or other PPI anymore. Increase in symptoms due likely to eating spicy food again. The breakthrough symptoms resolve with TUMS. Is going to restart Protonix. Having some more hernia symptoms including tenderness across his abdomen. Has not seen a surgeon lately, is going to see surgery soon to plan for surgical repair. Has had a couple episodes of recurrent brbpr associated with constipation. Recent colonoscopy is reassuring. Thinks the bleeding is from his  hemorrhoids. Having a bowel movement about Sensation of incomplete emptying, occasional hard stools. Has a bowel movement daily "but not usually a full one." Denies chest pain, dyspnea, dizziness, lightheadedness, syncope, near syncope. Denies any other upper or lower GI symptoms.  Past Medical History:  Diagnosis Date  . Anemia   . Cholecystitis   . GERD (gastroesophageal reflux disease)     Past Surgical History:  Procedure Laterality Date  . APPENDECTOMY    . CHOLECYSTECTOMY    . COLONOSCOPY    . COLONOSCOPY N/A 05/07/2015   Procedure: COLONOSCOPY;  Surgeon: Daneil Dolin, MD;  Location: [facility];  Service: Endoscopy;  Laterality: N/A;  0900  . ESOPHAGOGASTRODUODENOSCOPY N/A 05/07/2015   Procedure: ESOPHAGOGASTRODUODENOSCOPY (EGD);  Surgeon: Daneil Dolin, MD;  Location: [facility];  Service: Endoscopy;  Laterality: N/A;  . LEG SURGERY     right    No current outpatient prescriptions on file.   No current facility-administered medications for this visit.     Allergies as of 09/22/2015 - Review Complete 09/22/2015  Allergen Reaction Noted  . Penicillins Other (See Comments)     Family History  Problem Relation Age of Onset  . Osteoarthritis Mother   . Tuberculosis Father   . Colon cancer Neg Hx     Limited knowledge of family history    Social History   Social History  . Marital status: Married    Spouse name: N/A  . Number of children: N/A  . Years of education: N/A   Social History Main Topics  . Smoking status: Never Smoker  . Smokeless tobacco: Never Used  . Alcohol use No  . Drug  use: No  . Sexual activity: Not Asked   Other Topics Concern  . None   Social History Narrative  . None    Review of Systems: General: Negative for anorexia, weight loss, fever, chills, fatigue, weakness. ENT: Negative for hoarseness, difficulty swallowing. CV: Negative for chest pain, angina, palpitations, peripheral edema.  Respiratory: Negative for dyspnea  at rest, cough, sputum, wheezing.  GI: See history of present illness. Endo: Negative for unusual weight change.    Physical Exam: BP 121/77   Pulse 62   Temp 97.6 F (36.4 C) (Oral)   Ht 5\' 6"  (1.676 m)   Wt 217 lb (98.4 kg)   BMI 35.02 kg/m  General:   Obese male. Alert and oriented. Pleasant and cooperative. Well-nourished and well-developed.  Ears:  Normal auditory acuity. Cardiovascular:  S1, S2 present without murmurs appreciated. Extremities without clubbing or edema. Respiratory:  Clear to auscultation bilaterally. No wheezes, rales, or rhonchi. No distress.  Gastrointestinal:  +BS, soft, non-tender and non-distended. No HSM noted. No guarding or rebound. Midline abdominal hernia noted with abdominal flexion, soft and non-tender to palpation.  Rectal:  Deferred  Musculoskalatal:  Symmetrical without gross deformities. Neurologic:  Alert and oriented x4;  grossly normal neurologically. Psych:  Alert and cooperative. Normal mood and affect. Heme/Lymph/Immune: No excessive bruising noted.    09/22/2015 10:21 AM   Disclaimer: This note was dictated with voice recognition software. Similar sounding words can inadvertently be transcribed and may not be corrected upon review.

## 2015-09-22 NOTE — Assessment & Plan Note (Signed)
Patient with reflux esophagitis on recent EGD and GERD symptoms. He initially told the nurse that he was not taking any PPI and he was doing just fine. However, it appears she has breakthrough symptoms at least once a week. He is eating more spicy foods. Recommended to avoid trigger foods, restart Protonix due to documented esophagitis and ongoing GERD symptoms. Return for follow-up in 6 months.

## 2015-09-22 NOTE — Assessment & Plan Note (Signed)
Noted element of constipation. He has a bowel movement every day, but bowel movements are small and he has a sensation of incomplete emptying. He drinks about a liter of water a day. Recommended adequate water and fiber intake. He can add a fiber supplement 1-2 times a day to promote adequate bowel movements. If this is not enough, he can try MiraLAX 17 g once a day starting at 3 times a week at which point he can titrate up to daily or back to less often/when necessary depending on results. Return for follow-up in 6 months.

## 2015-09-22 NOTE — Assessment & Plan Note (Signed)
Noted recurrence of some hematochezia. This is associated with his constipation as described above. Recent colonoscopy this year is reassuring. Constipation management as noted above. He will call us if he wants rectal cream to help with his hemorrhoids. Return for follow-up in 6 months.

## 2015-09-22 NOTE — Progress Notes (Signed)
No pcp per patient 

## 2015-09-22 NOTE — Patient Instructions (Signed)
1. Take Protonix once a day as you were previously. 2. Make sure you're drinking enough water and eating enough fibrous foods such as fruits, vegetables, whole grains. 3. Start taking a fiber supplement once a day. You can get this over-the-counter and ask for the pharmacist to assist you if you need help. 4. Call and let us know if you would like a cream to help with her hemorrhoid symptoms. 5. Return for follow-up in 6 months. 6. Call us if you have any problems or worsening symptoms before then.

## 2015-11-12 DIAGNOSIS — K439 Ventral hernia without obstruction or gangrene: Secondary | ICD-10-CM | POA: Diagnosis not present

## 2015-11-12 NOTE — H&P (Signed)
  NT[facility] SOAP Note  Vital Signs:  Vitals as of: 123456: Systolic [id number]: Diastolic 70: Heart Rate 69: Temp 97.5F (Temporal): Height 37ft 6in: Weight 219Lbs 0 Ounces: BMI 35.35   BMI : 35.35 kg/m2  Subjective: This 55 year old male presents for of a ventral hernia.  Has been present for some time.  I saw patient earlier this year.  He states it seems to be larger in size.  He is ready to schedule surgery.  Made worse with straining or lifting something heavy.  Not particularly painful.  Does resolve when lying down or he pushes it back in.  Review of Symptoms:  Constitutional:negative Head:negative Eyes:negative Nose/Mouth/Throat:negative Cardiovascular:negative Respiratory:negative Gastrointestinheartburn Genitourinary:negative Musculoskeletal:negative Skin:negative Hematolgic/Lymphatic:negative Allergic/Immunologic:negative   Past Medical History:Reviewed  Past Medical History  Surgical History: lap cholecystectomy in remote past Medical Problems: none Allergies: pcn Medications: none   Social History:Reviewed  Social History  Preferred Language: English Race:  Other Ethnicity: Not Hispanic / Latino Age: 34 year Marital Status:  S Alcohol: no   Smoking Status: Never smoker reviewed on 11/12/2015 Functional Status reviewed on 11/12/2015 ------------------------------------------------ Bathing: Normal Cooking: Normal Dressing: Normal Driving: Normal Eating: Normal Managing Meds: Normal Oral Care: Normal Shopping: Normal Toileting: Normal Transferring: Normal Walking: Normal Cognitive Status reviewed on 11/12/2015 ------------------------------------------------ Attention: Normal Decision Making: Normal Language: Normal Memory: Normal Motor: Normal Perception: Normal Problem Solving: Normal Visual and Spatial: Normal   Family History:Reviewed  Family Health History Mother, Living; Healthy;  Father, Living; Healthy;      Objective Information: General:Well appearing, well nourished in no distress. Head:Atraumatic; no masses; no abnormalities Neck:Supple without lymphadenopathy.  Heart:RRR, no murmur or gallop.  Normal S1, S2.  No S3, S4.  Lungs:CTA bilaterally, no wheezes, rhonchi, rales.  Breathing unlabored. Abdomen:Soft, NT/ND, normal bowel sounds, no HSM, no masses.  No peritoneal signs.  Reducible 3cm ventral hernia along the midline, mid upper abdomen.  Assessment:Ventral hernia  Diagnoses: 553.20  K43.9 Hernia of anterior abdominal wall (Ventral hernia without obstruction or gangrene)  Procedures: [id number] - OFFICE OUTPATIENT VISIT 25 MINUTES    Plan:  Scheduled for ventral herniorrhaphy with mesh on 12/07/15.   Patient Education:Alternative treatments to surgery were discussed with patient (and family).Risks and benefits  of procedure including bleeding, infeciton, mesh use, and recurrence of the hernia were fully explained to the patient (and family) who gave informed consent. Patient/family questions were addressed.

## 2015-11-27 NOTE — Patient Instructions (Signed)
Alan Leblanc  11/27/2015     @PREFPERIOPPHARMACY @   Your procedure is scheduled on  12/07/2015   Report to [facility] at  615  A.M.  Call this number if you have problems the morning of surgery:  [phone]   Remember:  Do not eat food or drink liquids after midnight.  Take these medicines the morning of surgery with A SIP OF WATER none   Do not wear jewelry, make-up or nail polish.  Do not wear lotions, powders, or perfumes, or deoderant.  Do not shave 48 hours prior to surgery.  Men may shave face and neck.  Do not bring valuables to the hospital.  [facility] is not responsible for any belongings or valuables.  Contacts, dentures or bridgework may not be worn into surgery.  Leave your suitcase in the car.  After surgery it may be brought to your room.  For patients admitted to the hospital, discharge time will be determined by your treatment team.  Patients discharged the day of surgery will not be allowed to drive home.   Name and phone number of your driver:   family Special instructions:  none  Please read over the following fact sheets that you were given. Anesthesia Post-op Instructions and Care and Recovery After Surgery       Open Hernia Repair, Adult Open hernia repair is a surgical procedure to fix a hernia. A hernia occurs when an internal organ or tissue pushes out through a weak spot in the abdominal wall muscles. Hernias commonly occur in the groin and around the navel. Most hernias tend to get worse over time. Often, surgery is done to prevent the hernia from becoming bigger, uncomfortable, or an emergency. Emergency surgery may be needed if abdominal contents get stuck in the opening (incarcerated hernia) or the blood supply gets cut off (strangulated hernia). In an open repair, an incision is made in the abdomen to perform the surgery. Tell a health care provider about:  Any allergies you have.  All medicines you are taking,  including vitamins, herbs, eye drops, creams, and over-the-counter medicines.  Any problems you or family members have had with anesthetic medicines.  Any blood or bone disorders you have.  Any surgeries you have had.  Any medical conditions you have, including any recent cold or flu symptoms.  Whether you are pregnant or may be pregnant. What are the risks? Generally, this is a safe procedure. However, problems may occur, including:  Long-lasting (chronic) pain.  Bleeding.  Infection.  Damage to the testicle. This can cause shrinking or swelling.  Damage to the bladder, blood vessels, intestine, or nerves near the hernia.  Trouble passing urine.  Allergic reactions to medicines.  Return of the hernia. What happens before the procedure? Staying hydrated  Follow instructions from your health care provider about hydration, which may include:  Up to 2 hours before the procedure - you may continue to drink clear liquids, such as water, clear fruit juice, black coffee, and plain tea. Eating and drinking restrictions  Follow instructions from your health care provider about eating and drinking, which may include:  8 hours before the procedure - stop eating heavy meals or foods such as meat, fried foods, or fatty foods.  6 hours before the procedure - stop eating light meals or foods, such as toast or cereal.  6 hours before the procedure - stop drinking milk or drinks that contain milk.  2 hours before the procedure - stop drinking clear liquids. Medicines  Ask your health care provider about:  Changing or stopping your regular medicines. This is especially important if you are taking diabetes medicines or blood thinners.  Taking medicines such as aspirin and ibuprofen. These medicines can thin your blood. Do not take these medicines before your procedure if your health care provider instructs you not to.  You may be given antibiotic medicine to help prevent  infection. General instructions  You may have blood tests or imaging studies.  Ask your health care provider how your surgical site will be marked or identified.  If you smoke, do not smoke for at least 2 weeks before your procedure or for as long as told by your health care provider.  Let your health care provider know if you develop a cold or any infection before your surgery.  Plan to have someone take you home from the hospital or clinic.  If you will be going home right after the procedure, plan to have someone with you for 24 hours. What happens during the procedure?  To reduce your risk of infection:  Your health care team will wash or sanitize their hands.  Your skin will be washed with soap.  Hair may be removed from the surgical area.  An IV tube will be inserted into one of your veins.  You will be given one or more of the following:  A medicine to help you relax (sedative).  A medicine to numb the area (local anesthetic).  A medicine to make you fall asleep (general anesthetic).  Your surgeon will make an incision over the hernia.  The tissues of the hernia will be moved back into place.  The edges of the hernia may be stitched together.  The opening in the abdominal muscles will be closed with stitches (sutures). Or, your surgeon will place a mesh patch made of manmade (synthetic) material over the opening.  The incision will be closed.  A bandage (dressing) may be placed over the incision. The procedure may vary among health care providers and hospitals. What happens after the procedure?  Your blood pressure, heart rate, breathing rate, and blood oxygen level will be monitored until the medicines you were given have worn off.  You may be given medicine for pain.  Do not drive for 24 hours if you received a sedative. This information is not intended to replace advice given to you by your health care provider. Make sure you discuss any questions you  have with your health care provider. Document Released: 06/22/2000 Document Revised: 07/17/2015 Document Reviewed: 06/10/2015 Elsevier Interactive Patient Education  2017 [place] Repair, Adult, Care After These instructions give you information about caring for yourself after your procedure. Your doctor may also give you more specific instructions. If you have problems or questions, contact your doctor. Follow these instructions at home: Surgical cut (incision) care   Follow instructions from your doctor about how to take care of your surgical cut area. Make sure you:  Wash your hands with soap and water before you change your bandage (dressing). If you cannot use soap and water, use hand sanitizer.  Change your bandage as told by your doctor.  Leave stitches (sutures), skin glue, or skin tape (adhesive) strips in place. They may need to stay in place for 2 weeks or longer. If tape strips get loose and curl up, you may trim the loose edges. Do not remove tape strips  completely unless your doctor says it is okay.  Check your surgical cut every day for signs of infection. Check for:  More redness, swelling, or pain.  More fluid or blood.  Warmth.  Pus or a bad smell. Activity  Do not drive or use heavy machinery while taking prescription pain medicine. Do not drive until your doctor says it is okay.  Until your doctor says it is okay:  Do not lift anything that is heavier than 10 lb (4.5 kg).  Do not play contact sports.  Return to your normal activities as told by your doctor. Ask your doctor what activities are safe. General instructions  To prevent or treat having a hard time pooping (constipation) while you are taking prescription pain medicine, your doctor may recommend that you:  Drink enough fluid to keep your pee (urine) clear or pale yellow.  Take over-the-counter or prescription medicines.  Eat foods that are high in fiber, such as fresh fruits  and vegetables, whole grains, and beans.  Limit foods that are high in fat and processed sugars, such as fried and sweet foods.  Take over-the-counter and prescription medicines only as told by your doctor.  Do not take baths, swim, or use a hot tub until your doctor says it is okay.  Keep all follow-up visits as told by your doctor. This is important. Contact a doctor if:  You develop a rash.  You have more redness, swelling, or pain around your surgical cut.  You have more fluid or blood coming from your surgical cut.  Your surgical cut feels warm to the touch.  You have pus or a bad smell coming from your surgical cut.  You have a fever or chills.  You have blood in your poop (stool).  You have not pooped in 2-3 days.  Medicine does not help your pain. Get help right away if:  You have chest pain or you are short of breath.  You feel light-headed.  You feel weak and dizzy (feel faint).  You have very bad pain.  You throw up (vomit) and your pain is worse. This information is not intended to replace advice given to you by your health care provider. Make sure you discuss any questions you have with your health care provider. Document Released: 01/17/2014 Document Revised: 07/17/2015 Document Reviewed: 06/10/2015 Elsevier Interactive Patient Education  2017 [place] Anesthesia is a term that refers to techniques, procedures, and medicines that help a person stay safe and comfortable during a medical procedure. Monitored anesthesia care, or sedation, is one type of anesthesia. Your anesthesia specialist may recommend sedation if you will be having a procedure that does not require you to be unconscious, such as:  Cataract surgery.  A dental procedure.  A biopsy.  A colonoscopy. During the procedure, you may receive a medicine to help you relax (sedative). There are three levels of sedation:  Mild sedation. At this level, you may  feel awake and relaxed. You will be able to follow directions.  Moderate sedation. At this level, you will be sleepy. You may not remember the procedure.  Deep sedation. At this level, you will be asleep. You will not remember the procedure. The more medicine you are given, the deeper your level of sedation will be. Depending on how you respond to the procedure, the anesthesia specialist may change your level of sedation or the type of anesthesia to fit your needs. An anesthesia specialist will monitor you closely during  the procedure. Let your health care provider know about:  Any allergies you have.  All medicines you are taking, including vitamins, herbs, eye drops, creams, and over-the-counter medicines.  Any use of steroids (by mouth or as a cream).  Any problems you or family members have had with sedatives and anesthetic medicines.  Any blood disorders you have.  Any surgeries you have had.  Any medical conditions you have, such as sleep apnea.  Whether you are pregnant or may be pregnant.  Any use of cigarettes, alcohol, or street drugs. What are the risks? Generally, this is a safe procedure. However, problems may occur, including:  Getting too much medicine (oversedation).  Nausea.  Allergic reaction to medicines.  Trouble breathing. If this happens, a breathing tube may be used to help with breathing. It will be removed when you are awake and breathing on your own.  Heart trouble.  Lung trouble. Before the procedure Staying hydrated  Follow instructions from your health care provider about hydration, which may include:  Up to 2 hours before the procedure - you may continue to drink clear liquids, such as water, clear fruit juice, black coffee, and plain tea. Eating and drinking restrictions  Follow instructions from your health care provider about eating and drinking, which may include:  8 hours before the procedure - stop eating heavy meals or foods such as  meat, fried foods, or fatty foods.  6 hours before the procedure - stop eating light meals or foods, such as toast or cereal.  6 hours before the procedure - stop drinking milk or drinks that contain milk.  2 hours before the procedure - stop drinking clear liquids. Medicines  Ask your health care provider about:  Changing or stopping your regular medicines. This is especially important if you are taking diabetes medicines or blood thinners.  Taking medicines such as aspirin and ibuprofen. These medicines can thin your blood. Do not take these medicines before your procedure if your health care provider instructs you not to. Tests and exams  You will have a physical exam.  You may have blood tests done to show:  How well your kidneys and liver are working.  How well your blood can clot.  General instructions  Plan to have someone take you home from the hospital or clinic.  If you will be going home right after the procedure, plan to have someone with you for 24 hours. What happens during the procedure?  Your blood pressure, heart rate, breathing, level of pain and overall condition will be monitored.  An IV tube will be inserted into one of your veins.  Your anesthesia specialist will give you medicines as needed to keep you comfortable during the procedure. This may mean changing the level of sedation.  The procedure will be performed. After the procedure  Your blood pressure, heart rate, breathing rate, and blood oxygen level will be monitored until the medicines you were given have worn off.  Do not drive for 24 hours if you received a sedative.  You may:  Feel sleepy, clumsy, or nauseous.  Feel forgetful about what happened after the procedure.  Have a sore throat if you had a breathing tube during the procedure.  Vomit. This information is not intended to replace advice given to you by your health care provider. Make sure you discuss any questions you have with  your health care provider. Document Released: 09/22/2004 Document Revised: 06/05/2015 Document Reviewed: 04/19/2015 Elsevier Interactive Patient Education  2017 [employer] PATIENT INSTRUCTIONS POST-ANESTHESIA  IMMEDIATELY FOLLOWING SURGERY:  Do not drive or operate machinery for the first twenty four hours after surgery.  Do not make any important decisions for twenty four hours after surgery or while taking narcotic pain medications or sedatives.  If you develop intractable nausea and vomiting or a severe headache please notify your doctor immediately.  FOLLOW-UP:  Please make an appointment with your surgeon as instructed. You do not need to follow up with anesthesia unless specifically instructed to do so.  WOUND CARE INSTRUCTIONS (if applicable):  Keep a dry clean dressing on the anesthesia/puncture wound site if there is drainage.  Once the wound has quit draining you may leave it open to air.  Generally you should leave the bandage intact for twenty four hours unless there is drainage.  If the epidural site drains for more than 36-48 hours please call the anesthesia department.  QUESTIONS?:  Please feel free to call your physician or the hospital operator if you have any questions, and they will be happy to assist you.

## 2015-12-01 ENCOUNTER — Encounter (HOSPITAL_COMMUNITY)
Admission: RE | Admit: 2015-12-01 | Discharge: 2015-12-01 | Disposition: A | Discharge status: Home / Self Care | Payer: BLUE CROSS/BLUE SHIELD | Source: Ambulatory Visit | Attending: General Surgery | Admitting: General Surgery

## 2015-12-01 ENCOUNTER — Encounter (HOSPITAL_COMMUNITY): Payer: Self-pay

## 2015-12-01 DIAGNOSIS — Z01818 Encounter for other preprocedural examination: Secondary | ICD-10-CM | POA: Diagnosis not present

## 2015-12-01 HISTORY — DX: Sleep apnea, unspecified: G47.30

## 2015-12-01 LAB — BASIC METABOLIC PANEL
Anion gap: 6 (ref 5–15)
BUN: 16 mg/dL (ref 6–20)
CALCIUM: 8.9 mg/dL (ref 8.9–10.3)
CHLORIDE: 106 mmol/L (ref 101–111)
CO2: 25 mmol/L (ref 22–32)
CREATININE: 0.68 mg/dL (ref 0.61–1.24)
GFR calc non Af Amer: >60 mL/min (ref >60)
Glucose, Bld: 139 mg/dL — ABNORMAL HIGH (ref 65–99)
Potassium: 3.9 mmol/L (ref 3.5–5.1)
SODIUM: 137 mmol/L (ref 135–145)

## 2015-12-01 LAB — CBC WITH DIFFERENTIAL/PLATELET
BASOS PCT: 1 %
Basophils Absolute: 0.1 10*3/uL (ref 0.0–0.1)
EOS ABS: 0.2 10*3/uL (ref 0.0–0.7)
EOS PCT: 3 %
HCT: 32.9 % — ABNORMAL LOW (ref 39.0–52.0)
Hemoglobin: 9.7 g/dL — ABNORMAL LOW (ref 13.0–17.0)
LYMPHS ABS: 1.6 10*3/uL (ref 0.7–4.0)
Lymphocytes Relative: 25 %
MCH: 20.7 pg — AB (ref 26.0–34.0)
MCHC: 29.5 g/dL — AB (ref 30.0–36.0)
MCV: 70.3 fL — ABNORMAL LOW (ref 78.0–100.0)
MONOS PCT: 8 %
Monocytes Absolute: 0.5 10*3/uL (ref 0.1–1.0)
Neutro Abs: 4.1 10*3/uL (ref 1.7–7.7)
Neutrophils Relative %: 63 %
PLATELETS: 248 10*3/uL (ref 150–400)
RBC: 4.68 MIL/uL (ref 4.22–5.81)
RDW: 16.6 % — AB (ref 11.5–15.5)
WBC: 6.5 10*3/uL (ref 4.0–10.5)

## 2015-12-07 ENCOUNTER — Ambulatory Visit (HOSPITAL_COMMUNITY): Payer: BLUE CROSS/BLUE SHIELD | Admitting: Anesthesiology

## 2015-12-07 ENCOUNTER — Ambulatory Visit (HOSPITAL_COMMUNITY)
Admission: RE | Admit: 2015-12-07 | Discharge: 2015-12-07 | Disposition: A | Discharge status: Home / Self Care | Payer: BLUE CROSS/BLUE SHIELD | Source: Ambulatory Visit | Attending: General Surgery | Admitting: General Surgery

## 2015-12-07 ENCOUNTER — Encounter (HOSPITAL_COMMUNITY)
Admission: RE | Disposition: A | Discharge status: Home / Self Care | Payer: Self-pay | Source: Ambulatory Visit | Attending: General Surgery

## 2015-12-07 ENCOUNTER — Encounter (HOSPITAL_COMMUNITY): Payer: Self-pay

## 2015-12-07 DIAGNOSIS — G4730 Sleep apnea, unspecified: Secondary | ICD-10-CM | POA: Diagnosis not present

## 2015-12-07 DIAGNOSIS — K439 Ventral hernia without obstruction or gangrene: Secondary | ICD-10-CM | POA: Diagnosis not present

## 2015-12-07 DIAGNOSIS — K436 Other and unspecified ventral hernia with obstruction, without gangrene: Secondary | ICD-10-CM | POA: Diagnosis not present

## 2015-12-07 HISTORY — PX: VENTRAL HERNIA REPAIR: SHX424

## 2015-12-07 HISTORY — DX: Fracture of one rib, unspecified side, initial encounter for closed fracture: S22.39XA

## 2015-12-07 HISTORY — DX: Unspecified fracture of skull, initial encounter for closed fracture: S02.91XA

## 2015-12-07 HISTORY — DX: Fracture of neck, unspecified, initial encounter: S12.9XXA

## 2015-12-07 SURGERY — REPAIR, HERNIA, VENTRAL
Anesthesia: General | Site: Abdomen

## 2015-12-07 MED ORDER — PROPOFOL 10 MG/ML IV BOLUS
INTRAVENOUS | Status: DC | PRN
  Administered 2015-12-07: 50 mg via INTRAVENOUS
  Administered 2015-12-07: 150 mg via INTRAVENOUS

## 2015-12-07 MED ORDER — BUPIVACAINE LIPOSOME 1.3 % IJ SUSP
INTRAMUSCULAR | Status: AC
  Filled 2015-12-07: qty 20

## 2015-12-07 MED ORDER — KETOROLAC TROMETHAMINE 30 MG/ML IJ SOLN
30.0000 mg | Freq: Once | INTRAMUSCULAR | Status: DC
  Filled 2015-12-07: qty 1

## 2015-12-07 MED ORDER — NEOSTIGMINE METHYLSULFATE 10 MG/10ML IV SOLN
INTRAVENOUS | Status: DC | PRN
  Administered 2015-12-07: .6 mg via INTRAVENOUS

## 2015-12-07 MED ORDER — ONDANSETRON HCL 4 MG/2ML IJ SOLN
INTRAMUSCULAR | Status: AC
  Filled 2015-12-07: qty 2

## 2015-12-07 MED ORDER — VANCOMYCIN HCL IN DEXTROSE 1-5 GM/200ML-% IV SOLN
INTRAVENOUS | Status: AC
  Filled 2015-12-07: qty 200

## 2015-12-07 MED ORDER — POVIDONE-IODINE 10 % OINT PACKET
TOPICAL_OINTMENT | CUTANEOUS | Status: DC | PRN
Start: 2015-12-07 — End: 2015-12-07
  Administered 2015-12-07: 1 via TOPICAL

## 2015-12-07 MED ORDER — ROCURONIUM 10MG/ML (10ML) SYRINGE FOR MEDFUSION PUMP - OPTIME
INTRAVENOUS | Status: DC | PRN
  Administered 2015-12-07: 25 mg via INTRAVENOUS
  Administered 2015-12-07: 5 mg via INTRAVENOUS

## 2015-12-07 MED ORDER — HYDROCODONE-ACETAMINOPHEN 5-325 MG PO TABS: 1.0000 | ORAL_TABLET | ORAL | 0 refills | Status: DC | PRN

## 2015-12-07 MED ORDER — LACTATED RINGERS IV SOLN
INTRAVENOUS | Status: DC
  Administered 2015-12-07: 07:00:00 via INTRAVENOUS

## 2015-12-07 MED ORDER — ROCURONIUM BROMIDE 50 MG/5ML IV SOLN
INTRAVENOUS | Status: AC
  Filled 2015-12-07: qty 1

## 2015-12-07 MED ORDER — POVIDONE-IODINE 10 % EX OINT
TOPICAL_OINTMENT | CUTANEOUS | Status: AC
  Filled 2015-12-07: qty 1

## 2015-12-07 MED ORDER — CHLORHEXIDINE GLUCONATE CLOTH 2 % EX PADS: 6.0000 | MEDICATED_PAD | Freq: Once | CUTANEOUS | Status: DC

## 2015-12-07 MED ORDER — MIDAZOLAM HCL 2 MG/2ML IJ SOLN
INTRAMUSCULAR | Status: AC
  Filled 2015-12-07: qty 2

## 2015-12-07 MED ORDER — PROPOFOL 10 MG/ML IV BOLUS
INTRAVENOUS | Status: AC
  Filled 2015-12-07: qty 40

## 2015-12-07 MED ORDER — VANCOMYCIN HCL IN DEXTROSE 1-5 GM/200ML-% IV SOLN: 1000.0000 mg | INTRAVENOUS | Status: DC

## 2015-12-07 MED ORDER — LIDOCAINE HCL (PF) 1 % IJ SOLN
INTRAMUSCULAR | Status: AC
  Filled 2015-12-07: qty 5

## 2015-12-07 MED ORDER — FENTANYL CITRATE (PF) 100 MCG/2ML IJ SOLN
INTRAMUSCULAR | Status: DC | PRN
  Administered 2015-12-07 (×3): 50 ug via INTRAVENOUS

## 2015-12-07 MED ORDER — SUCCINYLCHOLINE CHLORIDE 20 MG/ML IJ SOLN
INTRAMUSCULAR | Status: AC
  Filled 2015-12-07: qty 1

## 2015-12-07 MED ORDER — MIDAZOLAM HCL 2 MG/2ML IJ SOLN
1.0000 mg | INTRAMUSCULAR | Status: DC | PRN
  Administered 2015-12-07 (×2): 2 mg via INTRAVENOUS
  Filled 2015-12-07: qty 2

## 2015-12-07 MED ORDER — HYDROMORPHONE HCL 1 MG/ML IJ SOLN: 0.2500 mg | INTRAMUSCULAR | Status: DC | PRN

## 2015-12-07 MED ORDER — ONDANSETRON HCL 4 MG/2ML IJ SOLN
4.0000 mg | Freq: Once | INTRAMUSCULAR | Status: AC
  Administered 2015-12-07: 4 mg via INTRAVENOUS

## 2015-12-07 MED ORDER — NEOSTIGMINE METHYLSULFATE 10 MG/10ML IV SOLN
INTRAVENOUS | Status: AC
  Filled 2015-12-07: qty 1

## 2015-12-07 MED ORDER — 0.9 % SODIUM CHLORIDE (POUR BTL) OPTIME
TOPICAL | Status: DC | PRN
  Administered 2015-12-07: 1000 mL

## 2015-12-07 MED ORDER — GLYCOPYRROLATE 0.2 MG/ML IJ SOLN
INTRAMUSCULAR | Status: AC
  Filled 2015-12-07: qty 4

## 2015-12-07 MED ORDER — LIDOCAINE HCL (CARDIAC) 10 MG/ML IV SOLN
INTRAVENOUS | Status: DC | PRN
  Administered 2015-12-07: 50 mg via INTRAVENOUS

## 2015-12-07 MED ORDER — BUPIVACAINE LIPOSOME 1.3 % IJ SUSP
INTRAMUSCULAR | Status: DC | PRN
  Administered 2015-12-07: 20 mL

## 2015-12-07 MED ORDER — FENTANYL CITRATE (PF) 250 MCG/5ML IJ SOLN
INTRAMUSCULAR | Status: AC
  Filled 2015-12-07: qty 5

## 2015-12-07 MED ORDER — SUCCINYLCHOLINE 20MG/ML (10ML) SYRINGE FOR MEDFUSION PUMP - OPTIME
INTRAMUSCULAR | Status: DC | PRN
  Administered 2015-12-07: 110 mg via INTRAVENOUS
  Administered 2015-12-07: 40 mg via INTRAVENOUS

## 2015-12-07 MED ORDER — GLYCOPYRROLATE 0.2 MG/ML IJ SOLN
INTRAMUSCULAR | Status: DC | PRN
  Administered 2015-12-07: .2 mg via INTRAVENOUS

## 2015-12-07 SURGICAL SUPPLY — 36 items
BAG HAMPER (MISCELLANEOUS) ×2 IMPLANT
CHLORAPREP W/TINT 26ML (MISCELLANEOUS) ×2 IMPLANT
CLOTH BEACON ORANGE TIMEOUT ST (SAFETY) ×2 IMPLANT
COVER LIGHT HANDLE STERIS (MISCELLANEOUS) ×4 IMPLANT
ELECT REM PT RETURN 9FT ADLT (ELECTROSURGICAL) ×2
ELECTRODE REM PT RTRN 9FT ADLT (ELECTROSURGICAL) ×1 IMPLANT
FORMALIN 10 PREFIL 480ML (MISCELLANEOUS) ×2 IMPLANT
GAUZE SPONGE 4X4 12PLY STRL (GAUZE/BANDAGES/DRESSINGS) ×2 IMPLANT
GLOVE BIOGEL PI IND STRL 7.0 (GLOVE) ×1 IMPLANT
GLOVE BIOGEL PI INDICATOR 7.0 (GLOVE) ×1
GLOVE SURG SS PI 7.5 STRL IVOR (GLOVE) ×2 IMPLANT
GOWN STRL REUS W/ TWL XL LVL3 (GOWN DISPOSABLE) IMPLANT
GOWN STRL REUS W/TWL LRG LVL3 (GOWN DISPOSABLE) ×4 IMPLANT
GOWN STRL REUS W/TWL XL LVL3 (GOWN DISPOSABLE)
INST SET MAJOR GENERAL (KITS) ×2 IMPLANT
KIT ROOM TURNOVER APOR (KITS) ×2 IMPLANT
MANIFOLD NEPTUNE II (INSTRUMENTS) ×2 IMPLANT
MESH VENTRALEX ST 1-7/10 CRC S (Mesh General) ×2 IMPLANT
NS IRRIG 1000ML POUR BTL (IV SOLUTION) ×2 IMPLANT
PACK ABDOMINAL MAJOR (CUSTOM PROCEDURE TRAY) ×2 IMPLANT
PAD ARMBOARD 7.5X6 YLW CONV (MISCELLANEOUS) ×2 IMPLANT
SET BASIN LINEN APH (SET/KITS/TRAYS/PACK) ×2 IMPLANT
STAPLER VISISTAT (STAPLE) ×2 IMPLANT
SUT ETHIBOND NAB MO 7 #0 18IN (SUTURE) ×2 IMPLANT
SUT NOVA NAB GS-21 1 T12 (SUTURE) IMPLANT
SUT NOVA NAB GS-22 2 2-0 T-19 (SUTURE) IMPLANT
SUT NOVA NAB GS-26 0 60 (SUTURE) IMPLANT
SUT SILK 2 0 (SUTURE)
SUT SILK 2-0 18XBRD TIE 12 (SUTURE) IMPLANT
SUT VIC AB 2-0 CT2 27 (SUTURE) IMPLANT
SUT VIC AB 3-0 SH 27 (SUTURE) ×1
SUT VIC AB 3-0 SH 27X BRD (SUTURE) ×1 IMPLANT
SUT VIC AB 4-0 PS2 27 (SUTURE) IMPLANT
SUT VICRYL AB 2 0 TIES (SUTURE) ×2 IMPLANT
SUT VICRYL AB 3 0 TIES (SUTURE) ×2 IMPLANT
SYR 20CC LL (SYRINGE) ×2 IMPLANT

## 2015-12-07 NOTE — Anesthesia Postprocedure Evaluation (Signed)
Anesthesia Post Note  Patient: Alan Leblanc  Procedure(s) Performed: Procedure(s) (LRB): HERNIA REPAIR VENTRAL ADULT WITH MESH (N/A)  Patient location during evaluation: PACU Anesthesia Type: General Level of consciousness: awake Pain management: satisfactory to patient Vital Signs Assessment: post-procedure vital signs reviewed and stable Respiratory status: spontaneous breathing Cardiovascular status: stable Anesthetic complications: no    Last Vitals:  Vitals:   12/07/15 0915 12/07/15 0934  BP: 120/72 116/73  Pulse: 61 (!) 57  Resp: 17 16  Temp:  36.6 C    Last Pain:  Vitals:   12/07/15 0934  TempSrc: Oral  PainSc: 2                  Lyncoln Ledgerwood

## 2015-12-07 NOTE — Op Note (Signed)
Patient:  Alan Leblanc  DOB:  07-09-1960  MRN:  [id number]   Preop Diagnosis:  Ventral hernia  Postop Diagnosis:  Same  Procedure:  Ventral herniorrhaphy with mesh  Surgeon:  Aviva Signs, M.D.  Anes:  Gen. endotracheal  Indications:  Patient is a 55 year old Hispanic male who presents with a symptomatic ventral hernia superior to the umbilicus. The risks and benefits of the procedure including bleeding, infection, mesh use, and the possibility of recurrence of the hernia were fully explained to the patient, gave informed consent.  Procedure note:  The patient was placed the supine position. After induction of general endotracheal anesthesia, the abdomen was prepped and draped using the usual sterile technique with DuraPrep. Surgical site confirmation was performed.  A midline incision was made superior to the umbilicus. This was taken down to the hernia defect. The patient had a 2 cm defect with a portion of properitoneal fat extending through it. This was freed away from the edges of the fascia and ligated at the peritoneal level using a 3-0 Vicryl. The remaining adipose tissue was disposed of. The properitoneal fat was then reduced and a 4.3 cm Bard Ventralax st patch was then inserted and secured in place using 0 Ethibond interrupted sutures. The fascia was then reapproximated over this in a transverse like manner using 0 Ethibond interrupted sutures. The subcutaneous layer was reapproximated using a 3-0 Vicryl interrupted suture. Exparel was instilled into the surrounding wound. The skin was closed using staples. Betadine ointment and dry sterile dressings were applied.  All tape and needle counts were correct at the end of the procedure. Patient was extubated in the operating room and transferred to PACU in stable condition.  Complications:  None  EBL:  Minimal  Specimen:  None

## 2015-12-07 NOTE — Anesthesia Procedure Notes (Signed)
Procedure Name: Intubation Date/Time: 12/07/2015 7:57 AM Performed by: Tressie Stalker E Pre-anesthesia Checklist: Patient identified, Patient being monitored, Timeout performed, Emergency Drugs available and Suction available Patient Re-evaluated:Patient Re-evaluated prior to inductionOxygen Delivery Method: Circle system utilized Preoxygenation: Pre-oxygenation with 100% oxygen Intubation Type: IV induction, Rapid sequence and Cricoid Pressure applied Ventilation: Mask ventilation without difficulty Laryngoscope Size: Glidescope Grade View: Grade I Tube type: Oral Tube size: 7.0 mm Number of attempts: 1 Airway Equipment and Method: Rigid stylet and Video-laryngoscopy Placement Confirmation: ETT inserted through vocal cords under direct vision,  positive ETCO2 and breath sounds checked- equal and bilateral Secured at: 21 cm Tube secured with: Tape Dental Injury: Teeth and Oropharynx as per pre-operative assessment  Comments: CRNA attempted to intubate with glidescope, redundant tissue and unsuccessful.  Dr. Patsey Berthold called and intubated with glidescope on first qattempt.

## 2015-12-07 NOTE — Anesthesia Preprocedure Evaluation (Signed)
Anesthesia Evaluation  Patient identified by MRN, date of birth, ID band Patient awake    Reviewed: Allergy & Precautions, NPO status , Patient's Chart, lab work & pertinent test results  Airway Mallampati: III  TM Distance: >3 FB     Dental  (+) Teeth Intact, Implants   Pulmonary sleep apnea ,    breath sounds clear to auscultation       Cardiovascular negative cardio ROS   Rhythm:Regular Rate:Normal     Neuro/Psych    GI/Hepatic hiatal hernia, GERD  ,  Endo/Other    Renal/GU      Musculoskeletal   Abdominal   Peds  Hematology  (+) anemia ,   Anesthesia Other Findings   Reproductive/Obstetrics                             Anesthesia Physical Anesthesia Plan  ASA: II  Anesthesia Plan: General   Post-op Pain Management:    Induction: Intravenous, Rapid sequence and Cricoid pressure planned  Airway Management Planned: Oral ETT and Video Laryngoscope Planned  Additional Equipment:   Intra-op Plan:   Post-operative Plan: Extubation in OR  Informed Consent: I have reviewed the patients History and Physical, chart, labs and discussed the procedure including the risks, benefits and alternatives for the proposed anesthesia with the patient or authorized representative who has indicated his/her understanding and acceptance.     Plan Discussed with:   Anesthesia Plan Comments:         Anesthesia Quick Evaluation

## 2015-12-07 NOTE — Discharge Instructions (Signed)
Open Hernia Repair, Adult, Care After °These instructions give you information about caring for yourself after your procedure. Your doctor may also give you more specific instructions. If you have problems or questions, contact your doctor. °Follow these instructions at home: °Surgical cut (incision) care  ° °· Follow instructions from your doctor about how to take care of your surgical cut area. Make sure you: °¨ Wash your hands with soap and water before you change your bandage (dressing). If you cannot use soap and water, use hand sanitizer. °¨ Change your bandage as told by your doctor. °¨ Leave stitches (sutures), skin glue, or skin tape (adhesive) strips in place. They may need to stay in place for 2 weeks or longer. If tape strips get loose and curl up, you may trim the loose edges. Do not remove tape strips completely unless your doctor says it is okay. °· Check your surgical cut every day for signs of infection. Check for: °¨ More redness, swelling, or pain. °¨ More fluid or blood. °¨ Warmth. °¨ Pus or a bad smell. °Activity  °· Do not drive or use heavy machinery while taking prescription pain medicine. Do not drive until your doctor says it is okay. °· Until your doctor says it is okay: °¨ Do not lift anything that is heavier than 10 lb (4.5 kg). °¨ Do not play contact sports. °· Return to your normal activities as told by your doctor. Ask your doctor what activities are safe. °General instructions  °· To prevent or treat having a hard time pooping (constipation) while you are taking prescription pain medicine, your doctor may recommend that you: °¨ Drink enough fluid to keep your pee (urine) clear or pale yellow. °¨ Take over-the-counter or prescription medicines. °¨ Eat foods that are high in fiber, such as fresh fruits and vegetables, whole grains, and beans. °¨ Limit foods that are high in fat and processed sugars, such as fried and sweet foods. °· Take over-the-counter and prescription medicines only  as told by your doctor. °· Do not take baths, swim, or use a hot tub until your doctor says it is okay. °· Keep all follow-up visits as told by your doctor. This is important. °Contact a doctor if: °· You develop a rash. °· You have more redness, swelling, or pain around your surgical cut. °· You have more fluid or blood coming from your surgical cut. °· Your surgical cut feels warm to the touch. °· You have pus or a bad smell coming from your surgical cut. °· You have a fever or chills. °· You have blood in your poop (stool). °· You have not pooped in 2-3 days. °· Medicine does not help your pain. °Get help right away if: °· You have chest pain or you are short of breath. °· You feel light-headed. °· You feel weak and dizzy (feel faint). °· You have very bad pain. °· You throw up (vomit) and your pain is worse. °This information is not intended to replace advice given to you by your health care provider. Make sure you discuss any questions you have with your health care provider. °Document Released: 01/17/2014 Document Revised: 07/17/2015 Document Reviewed: 06/10/2015 °Elsevier Interactive Patient Education © 2017 [employer] °Ventral Hernia °A ventral hernia is a bulge of tissue from inside the abdomen that pushes through a weak area of the muscles that form the front wall of the abdomen. The tissues inside the abdomen are inside a sac (peritoneum). These tissues include the small intestine,   large intestine, and the fatty tissue that covers the intestines (omentum). Sometimes, the bulge that forms a hernia contains intestines. Other hernias contain only fat. Ventral hernias do not go away without surgical treatment. °There are several types of ventral hernias. You may have: °· A hernia at an incision site from previous abdominal surgery (incisional hernia). °· A hernia just above the belly button (epigastric hernia), or at the belly button (umbilical hernia). These types of hernias can develop from heavy lifting  or straining. °· A hernia that comes and goes (reducible hernia). It may be visible only when you lift or strain. This type of hernia can be pushed back into the abdomen (reduced). °· A hernia that traps abdominal tissue inside the hernia (incarcerated hernia). This type of hernia does not reduce. °· A hernia that cuts off blood flow to the tissues inside the hernia (strangulated hernia). The tissues can start to die if this happens. This is a very painful bulge that cannot be reduced. A strangulated hernia is a medical emergency. °What are the causes? °This condition is caused by abdominal tissue putting pressure on an area of weakness in the abdominal muscles. °What increases the risk? °The following factors may make you more likely to develop this condition: °· Being male. °· Being 60 or older. °· Being overweight or obese. °· Having had previous abdominal surgery, especially if there was an infection after surgery. °· Having had an injury to the abdominal wall. °· Having had several pregnancies. °· Having a buildup of fluid inside the abdomen (ascites). °What are the signs or symptoms? °The only symptom of a ventral hernia may be a painless bulge in the abdomen. A reducible hernia may be visible only when you strain, cough, or lift. Other symptoms may include: °· Dull pain. °· A feeling of pressure. °Signs and symptoms of a strangulated hernia may include: °· Increasing pain. °· Nausea and vomiting. °· Pain when pressing on the hernia. °· The skin over the hernia turning red or purple. °· Constipation. °· Blood in the stool (feces). °How is this diagnosed? °This condition may be diagnosed based on: °· Your symptoms. °· Your medical history. °· A physical exam. You may be asked to cough or strain while standing. These actions increase the pressure inside your abdomen and force the hernia through the opening in your muscles. Your health care provider may try to reduce the hernia by pressing on it. °· Imaging  studies, such as an ultrasound or CT scan. °How is this treated? °This condition is treated with surgery. If you have a strangulated hernia, surgery is done as soon as possible. If your hernia is small and not incarcerated, you may be asked to lose some weight before surgery. °Follow these instructions at home: °· Follow instructions from your health care provider about eating or drinking restrictions. °· If you are overweight, your health care provider may recommend that you increase your activity level and eat a healthier diet. °· Do not lift anything that is heavier than 10 lb (4.5 kg). °· Return to your normal activities as told by your health care provider. Ask your health care provider what activities are safe for you. You may need to avoid activities that increase pressure on your hernia. °· Take over-the-counter and prescription medicines only as told by your health care provider. °· Keep all follow-up visits as told by your health care provider. This is important. °Contact a health care provider if: °· Your hernia gets larger. °·   Your hernia becomes painful. °Get help right away if: °· Your hernia becomes increasingly painful. °· You have pain along with any of the following: °¨ Changes in skin color in the area of the hernia. °¨ Nausea. °¨ Vomiting. °¨ Fever. °Summary °· A ventral hernia is a bulge of tissue from inside the abdomen that pushes through a weak area of the muscles that form the front wall of the abdomen. °· This condition is treated with surgery, which may be urgent depending on your hernia. °· Do not lift anything that is heavier than 10 lb (4.5 kg), and follow activity instructions from your health care provider. °This information is not intended to replace advice given to you by your health care provider. Make sure you discuss any questions you have with your health care provider. °Document Released: 12/14/2011 Document Revised: 08/14/2015 Document Reviewed: 08/14/2015 °Elsevier  Interactive Patient Education © 2017 [employer] Inc. ° °

## 2015-12-07 NOTE — Interval H&P Note (Signed)
History and Physical Interval Note:  12/07/2015 7:23 AM  Alan Leblanc  has presented today for surgery, with the diagnosis of ventral hernia  The various methods of treatment have been discussed with the patient and family. After consideration of risks, benefits and other options for treatment, the patient has consented to  Procedure(s): HERNIA REPAIR VENTRAL ADULT WITH MESH (N/A) as a surgical intervention .  The patient's history has been reviewed, patient examined, no change in status, stable for surgery.  I have reviewed the patient's chart and labs.  Questions were answered to the patient's satisfaction.     Aviva Signs A

## 2015-12-07 NOTE — Transfer of Care (Signed)
Immediate Anesthesia Transfer of Care Note  Patient: Alan Leblanc  Procedure(s) Performed: Procedure(s): HERNIA REPAIR VENTRAL ADULT WITH MESH (N/A)  Patient Location: PACU  Anesthesia Type:General  Level of Consciousness: awake  Airway & Oxygen Therapy: Patient Spontanous Breathing and Patient connected to face mask oxygen  Post-op Assessment: Report given to RN  Post vital signs: Reviewed and stable  Last Vitals:  Vitals:   12/07/15 0730 12/07/15 0735  BP: 114/72 111/73  Pulse:    Resp: 17 18  Temp:      Last Pain:  Vitals:   12/07/15 0640  TempSrc: Oral      Patients Stated Pain Goal: 3 ([id number])  Complications: No apparent anesthesia complications

## 2015-12-09 ENCOUNTER — Encounter (HOSPITAL_COMMUNITY): Payer: Self-pay | Admitting: General Surgery

## 2016-03-21 ENCOUNTER — Encounter: Payer: Self-pay | Admitting: Nurse Practitioner

## 2016-03-21 ENCOUNTER — Ambulatory Visit (INDEPENDENT_AMBULATORY_CARE_PROVIDER_SITE_OTHER): Payer: BLUE CROSS/BLUE SHIELD | Admitting: Nurse Practitioner

## 2016-03-21 VITALS — BP 149/84 | HR 66 | Temp 97.6°F | Ht 67.0 in | Wt 226.2 lb

## 2016-03-21 DIAGNOSIS — K5900 Constipation, unspecified: Secondary | ICD-10-CM

## 2016-03-21 DIAGNOSIS — K2100 Gastro-esophageal reflux disease with esophagitis, without bleeding: Secondary | ICD-10-CM

## 2016-03-21 MED ORDER — PANTOPRAZOLE SODIUM 40 MG PO TBEC: 40.0000 mg | DELAYED_RELEASE_TABLET | Freq: Every day | ORAL | 11 refills | Status: DC | PRN

## 2016-03-21 NOTE — Progress Notes (Signed)
Referring Provider: No ref. provider found Primary Care Physician:  No PCP Per Patient Primary GI:  Dr. Gala Romney  Chief Complaint  Patient presents with  . Gastroesophageal Reflux    discomfort mid upper abd (has patch from hernia repair)  . Constipation    HPI:   Alan Leblanc is a 56 y.o. male who presents For follow-up on GERD and constipation. The patient was last seen in our office 09/22/2015 for reflux esophagitis, constipation, hematochezia. At that time his GERD was much improved with occasional breakthrough, not on PPI at that time. He noted he was eating spicy food again and symptoms typically resolve with Tums. He decided to restart Protonix. Noted hernia symptoms plan for surgical evaluation. Intermittent/rare hematochezia with associated constipation with recent colonoscopy deemed reassuring. He did appear to have a surgical evaluation and underwent ventral hernia repair with mesh placement on 12/07/2015.  Today he states He is doing well overall. Occasional/rare breakthrough symptoms. These tend to resolve on times. He is not taking Protonix anymore. He is able to drink coffee without issue. Some intermittent constipation. Will sometimes go 2-3 times a day and other times he will not have a bowel movement for 2-3 days. Denies any further hematochezia. His hernia incisional area seems to feel "abnormal" like something is pulling. I discussed this is likely normal scar tissue formation. If he continues to have a problem or has worsening pain recommended follow-up with surgery. He is anxious to get back to [facility] because his daughter is in active labor at this time. Denies chest pain, dyspnea, dizziness, lightheadedness, syncope, near syncope. Denies any other upper or lower GI symptoms.  Past Medical History:  Diagnosis Date  . Anemia   . Broken neck ([place]) 2003  . Cholecystitis   . GERD (gastroesophageal reflux disease)   . Rib fractures 2003  . Skull fracture ([place])  2003  . Sleep apnea    Does not use CPAP    Past Surgical History:  Procedure Laterality Date  . APPENDECTOMY    . CHOLECYSTECTOMY    . COLONOSCOPY    . COLONOSCOPY N/A 05/07/2015   Procedure: COLONOSCOPY;  Surgeon: Daneil Dolin, MD;  [facility];  Service: Endoscopy;  Laterality: N/A;  0900  . ESOPHAGOGASTRODUODENOSCOPY N/A 05/07/2015   Procedure: ESOPHAGOGASTRODUODENOSCOPY (EGD);  Surgeon: Daneil Dolin, MD;  Location: [facility];  Service: Endoscopy;  Laterality: N/A;  . LEG SURGERY Right     right ankle fx  . VENTRAL HERNIA REPAIR N/A 12/07/2015   Procedure: HERNIA REPAIR VENTRAL ADULT WITH MESH;  Surgeon: Aviva Signs, MD;  Location: [facility] ORS;  Service: General;  Laterality: N/A;    Current Outpatient Prescriptions  Medication Sig Dispense Refill  . pantoprazole (PROTONIX) 40 MG tablet Take 1 tablet (40 mg total) by mouth daily as needed (for acid reflux). 30 tablet 11  . HYDROcodone-acetaminophen (NORCO/VICODIN) 5-325 MG tablet Take 1-2 tablets by mouth every 4 (four) hours as needed for moderate pain. (Patient not taking: Reported on 03/21/2016) 50 tablet 0   No current facility-administered medications for this visit.     Allergies as of 03/21/2016 - Review Complete 03/21/2016  Allergen Reaction Noted  . Penicillins Other (See Comments)     Family History  Problem Relation Age of Onset  . Osteoarthritis Mother   . Tuberculosis Father   . Colon cancer Neg Hx     Limited knowledge of family history    Social History   Social  History  . Marital status: Married    Spouse name: N/A  . Number of children: N/A  . Years of education: N/A   Social History Main Topics  . Smoking status: Never Smoker  . Smokeless tobacco: Never Used  . Alcohol use No  . Drug use: No  . Sexual activity: Not Asked   Other Topics Concern  . None   Social History Narrative  . None    Review of Systems: General: Negative for anorexia, weight loss, fever, chills,  fatigue, weakness. ENT: Negative for hoarseness, difficulty swallowing. CV: Negative for chest pain, angina, palpitations, peripheral edema.  Respiratory: Negative for dyspnea at rest, cough, sputum, wheezing.  GI: See history of present illness. Endo: Negative for unusual weight change.  Heme: Negative for bruising or bleeding.   Physical Exam: BP (!) 149/84   Pulse 66   Temp 97.6 F (36.4 C) (Oral)   Ht 5\' 7"  (1.702 m)   Wt 226 lb 3.2 oz (102.6 kg)   BMI 35.43 kg/m  General:   Alert and oriented. Pleasant and cooperative. Well-nourished and well-developed.   Ears:  Normal auditory acuity. Cardiovascular:  S1, S2 present without murmurs appreciated. Extremities without clubbing or edema. Respiratory:  Clear to auscultation bilaterally. No wheezes, rales, or rhonchi. No distress.  Gastrointestinal:  +BS, rounded but soft, non-tender and non-distended. No HSM noted. No guarding or rebound. No masses appreciated.  Rectal:  Deferred  Musculoskalatal:  Symmetrical without gross deformities. Neurologic:  Alert and oriented x4;  grossly normal neurologically. Psych:  Alert and cooperative. Normal mood and affect. Heme/Lymph/Immune: No excessive bruising noted.    03/21/2016 8:37 AM   Disclaimer: This note was dictated with voice recognition software. Similar sounding words can inadvertently be transcribed and may not be corrected upon review.

## 2016-03-21 NOTE — Assessment & Plan Note (Signed)
Overall improved. Typically goes every day, occasionally will have 2-3 days without a bowel movement. I will recommend MiraLAX one to 2 times a day for no bowel movement in 2 days. Return for follow-up in one year.

## 2016-03-21 NOTE — Progress Notes (Signed)
No pcp per patient 

## 2016-03-21 NOTE — Assessment & Plan Note (Signed)
Symptoms overall doing very well. If he has recurrent symptoms I recommended he restart Protonix. Otherwise, continue spot management as necessary for rare symptoms. Return for follow-up in one year. I will send in a refill of his Protonix for him.

## 2016-03-21 NOTE — Patient Instructions (Addendum)
1. He can take Protonix if she needed to if her symptoms become more frequent. 2. Take MiraLAX 17 g of powder mixed into a beverage of your choice 1-2 times a day if you go 2 days without a bowel movement. 3. Return for follow-up in one year. 4. Call us if you have any worsening problems.   Congrats on the new grandbaby!!!

## 2016-03-23 DIAGNOSIS — G8929 Other chronic pain: Secondary | ICD-10-CM | POA: Diagnosis not present

## 2016-03-23 DIAGNOSIS — M25561 Pain in right knee: Secondary | ICD-10-CM | POA: Diagnosis not present

## 2016-03-29 DIAGNOSIS — M1711 Unilateral primary osteoarthritis, right knee: Secondary | ICD-10-CM | POA: Diagnosis not present

## 2016-03-29 DIAGNOSIS — M2351 Chronic instability of knee, right knee: Secondary | ICD-10-CM | POA: Diagnosis not present

## 2016-03-29 DIAGNOSIS — M2391 Unspecified internal derangement of right knee: Secondary | ICD-10-CM | POA: Diagnosis not present

## 2016-07-15 DIAGNOSIS — Z6837 Body mass index (BMI) 37.0-37.9, adult: Secondary | ICD-10-CM | POA: Diagnosis not present

## 2016-07-15 DIAGNOSIS — G4730 Sleep apnea, unspecified: Secondary | ICD-10-CM | POA: Diagnosis not present

## 2016-07-15 DIAGNOSIS — F330 Major depressive disorder, recurrent, mild: Secondary | ICD-10-CM | POA: Diagnosis not present

## 2016-07-15 DIAGNOSIS — M25561 Pain in right knee: Secondary | ICD-10-CM | POA: Diagnosis not present

## 2016-07-29 DIAGNOSIS — Z0000 Encounter for general adult medical examination without abnormal findings: Secondary | ICD-10-CM | POA: Diagnosis not present

## 2016-07-29 DIAGNOSIS — E291 Testicular hypofunction: Secondary | ICD-10-CM | POA: Diagnosis not present

## 2016-07-29 DIAGNOSIS — Z1159 Encounter for screening for other viral diseases: Secondary | ICD-10-CM | POA: Diagnosis not present

## 2016-07-29 DIAGNOSIS — Z125 Encounter for screening for malignant neoplasm of prostate: Secondary | ICD-10-CM | POA: Diagnosis not present

## 2016-08-10 DIAGNOSIS — Z0001 Encounter for general adult medical examination with abnormal findings: Secondary | ICD-10-CM | POA: Diagnosis not present

## 2016-08-10 DIAGNOSIS — G4733 Obstructive sleep apnea (adult) (pediatric): Secondary | ICD-10-CM | POA: Diagnosis not present

## 2016-08-10 DIAGNOSIS — F330 Major depressive disorder, recurrent, mild: Secondary | ICD-10-CM | POA: Diagnosis not present

## 2016-08-10 DIAGNOSIS — D649 Anemia, unspecified: Secondary | ICD-10-CM | POA: Diagnosis not present

## 2016-08-17 DIAGNOSIS — Z6837 Body mass index (BMI) 37.0-37.9, adult: Secondary | ICD-10-CM | POA: Diagnosis not present

## 2016-08-17 DIAGNOSIS — D649 Anemia, unspecified: Secondary | ICD-10-CM | POA: Diagnosis not present

## 2016-08-17 DIAGNOSIS — K2970 Gastritis, unspecified, without bleeding: Secondary | ICD-10-CM | POA: Diagnosis not present

## 2016-08-30 ENCOUNTER — Emergency Department (HOSPITAL_COMMUNITY)
Admission: EM | Admit: 2016-08-30 | Discharge: 2016-08-30 | Disposition: A | Discharge status: Home / Self Care | Payer: BLUE CROSS/BLUE SHIELD | Attending: Emergency Medicine | Admitting: Emergency Medicine

## 2016-08-30 ENCOUNTER — Ambulatory Visit (INDEPENDENT_AMBULATORY_CARE_PROVIDER_SITE_OTHER): Payer: BLUE CROSS/BLUE SHIELD | Admitting: General Surgery

## 2016-08-30 ENCOUNTER — Encounter (HOSPITAL_COMMUNITY)
Admission: RE | Admit: 2016-08-30 | Discharge: 2016-08-30 | Disposition: A | Discharge status: Home / Self Care | Payer: BLUE CROSS/BLUE SHIELD | Source: Ambulatory Visit | Attending: General Surgery | Admitting: General Surgery

## 2016-08-30 ENCOUNTER — Encounter: Payer: Self-pay | Admitting: General Surgery

## 2016-08-30 ENCOUNTER — Encounter (HOSPITAL_COMMUNITY): Payer: Self-pay

## 2016-08-30 ENCOUNTER — Encounter (HOSPITAL_COMMUNITY): Payer: Self-pay | Admitting: *Deleted

## 2016-08-30 VITALS — BP 145/70 | HR 65 | Temp 98.5°F | Resp 18 | Ht 67.0 in | Wt 220.0 lb

## 2016-08-30 DIAGNOSIS — Z79899 Other long term (current) drug therapy: Secondary | ICD-10-CM | POA: Insufficient documentation

## 2016-08-30 DIAGNOSIS — K645 Perianal venous thrombosis: Secondary | ICD-10-CM

## 2016-08-30 DIAGNOSIS — K219 Gastro-esophageal reflux disease without esophagitis: Secondary | ICD-10-CM | POA: Diagnosis not present

## 2016-08-30 DIAGNOSIS — G4730 Sleep apnea, unspecified: Secondary | ICD-10-CM | POA: Diagnosis not present

## 2016-08-30 DIAGNOSIS — K648 Other hemorrhoids: Secondary | ICD-10-CM | POA: Diagnosis not present

## 2016-08-30 DIAGNOSIS — E669 Obesity, unspecified: Secondary | ICD-10-CM | POA: Diagnosis not present

## 2016-08-30 DIAGNOSIS — K643 Fourth degree hemorrhoids: Secondary | ICD-10-CM | POA: Diagnosis not present

## 2016-08-30 DIAGNOSIS — K6289 Other specified diseases of anus and rectum: Secondary | ICD-10-CM | POA: Diagnosis present

## 2016-08-30 LAB — BASIC METABOLIC PANEL
Anion gap: 7 (ref 5–15)
BUN: 13 mg/dL (ref 6–20)
CALCIUM: 9 mg/dL (ref 8.9–10.3)
CO2: 26 mmol/L (ref 22–32)
Chloride: 104 mmol/L (ref 101–111)
Creatinine, Ser: 0.65 mg/dL (ref 0.61–1.24)
GFR calc non Af Amer: >60 mL/min (ref >60)
Glucose, Bld: 103 mg/dL — ABNORMAL HIGH (ref 65–99)
Potassium: 3.8 mmol/L (ref 3.5–5.1)
Sodium: 137 mmol/L (ref 135–145)

## 2016-08-30 LAB — CBC WITH DIFFERENTIAL/PLATELET
BASOS PCT: 0 %
Basophils Absolute: 0 10*3/uL (ref 0.0–0.1)
EOS ABS: 0.1 10*3/uL (ref 0.0–0.7)
EOS PCT: 1 %
HCT: 37 % — ABNORMAL LOW (ref 39.0–52.0)
HEMOGLOBIN: 11.4 g/dL — AB (ref 13.0–17.0)
LYMPHS PCT: 15 %
Lymphs Abs: 1.5 10*3/uL (ref 0.7–4.0)
MCH: 23 pg — AB (ref 26.0–34.0)
MCHC: 30.8 g/dL (ref 30.0–36.0)
MCV: 74.7 fL — ABNORMAL LOW (ref 78.0–100.0)
Monocytes Absolute: 0.6 10*3/uL (ref 0.1–1.0)
Monocytes Relative: 6 %
NEUTROS PCT: 78 %
Neutro Abs: 7.7 10*3/uL (ref 1.7–7.7)
Platelets: 258 10*3/uL (ref 150–400)
RBC: 4.95 MIL/uL (ref 4.22–5.81)
RDW: 22.3 % — ABNORMAL HIGH (ref 11.5–15.5)
WBC: 9.9 10*3/uL (ref 4.0–10.5)

## 2016-08-30 MED ORDER — LIDOCAINE VISCOUS 2 % MT SOLN
15.0000 mL | Freq: Once | OROMUCOSAL | Status: AC
  Administered 2016-08-30: 15 mL via OROMUCOSAL
  Filled 2016-08-30: qty 15

## 2016-08-30 MED ORDER — DOCUSATE SODIUM 100 MG PO CAPS: 100.0000 mg | ORAL_CAPSULE | Freq: Two times a day (BID) | ORAL | 0 refills | Status: DC

## 2016-08-30 MED ORDER — LIDOCAINE-EPINEPHRINE (PF) 2 %-1:200000 IJ SOLN
10.0000 mL | Freq: Once | INTRAMUSCULAR | Status: AC
  Administered 2016-08-30: 10 mL
  Filled 2016-08-30: qty 20

## 2016-08-30 MED ORDER — TRAMADOL HCL 50 MG PO TABS: 50.0000 mg | ORAL_TABLET | Freq: Four times a day (QID) | ORAL | 0 refills | Status: DC | PRN

## 2016-08-30 MED ORDER — OXYCODONE-ACETAMINOPHEN 7.5-325 MG PO TABS: 1.0000 | ORAL_TABLET | ORAL | 0 refills | Status: DC | PRN

## 2016-08-30 MED ORDER — OXYCODONE-ACETAMINOPHEN 5-325 MG PO TABS
2.0000 | ORAL_TABLET | Freq: Once | ORAL | Status: AC
  Administered 2016-08-30: 2 via ORAL
  Filled 2016-08-30: qty 2

## 2016-08-30 NOTE — H&P (Signed)
Alan Leblanc [place]; [id number]; [birth year]   HPI patient is a 56 year old Hispanic male who presented the emergency room last night with rectal pain secondary to thrombosed hemorrhoids. He states he has had hemorrhoidal disease in the past, but he started having significant pain over the last 24 hours. In the emergency room, he was noted to have large external hemorrhoids with one that was thrombosed. Initial attempt to was not very successful. He was referred to my care for further evaluation treatment. He states he has a pain of 10 out of 10.  Past Medical History:  Diagnosis Date  . Anemia   . Broken neck ([place]) 2003  . Cholecystitis   . GERD (gastroesophageal reflux disease)   . Rib fractures 2003  . Skull fracture ([place]) 2003  . Sleep apnea    Does not use CPAP    Past Surgical History:  Procedure Laterality Date  . APPENDECTOMY    . CHOLECYSTECTOMY    . COLONOSCOPY    . COLONOSCOPY N/A 05/07/2015   Procedure: COLONOSCOPY;  Surgeon: Daneil Dolin, MD;  [facility];  Service: Endoscopy;  Laterality: N/A;  0900  . ESOPHAGOGASTRODUODENOSCOPY N/A 05/07/2015   Procedure: ESOPHAGOGASTRODUODENOSCOPY (EGD);  Surgeon: Daneil Dolin, MD;  [facility];  Service: Endoscopy;  Laterality: N/A;  . LEG SURGERY Right     right ankle fx  . VENTRAL HERNIA REPAIR N/A 12/07/2015   Procedure: HERNIA REPAIR VENTRAL ADULT WITH MESH;  Surgeon: Aviva Signs, MD;  Location: [facility];  Service: General;  Laterality: N/A;    Family History  Problem Relation Age of Onset  . Osteoarthritis Mother   . Tuberculosis Father   . Colon cancer Neg Hx        Limited knowledge of family history    Current Outpatient Prescriptions on File Prior to Visit  Medication Sig Dispense Refill  . docusate sodium (COLACE) 100 MG capsule Take 1 capsule (100 mg total) by mouth every 12 (twelve) hours. 60 capsule 0  . HYDROcodone-acetaminophen (NORCO/VICODIN) 5-325 MG tablet Take 1-2 tablets by mouth every 4  (four) hours as needed for moderate pain. (Patient not taking: Reported on 03/21/2016) 50 tablet 0  . pantoprazole (PROTONIX) 40 MG tablet Take 1 tablet (40 mg total) by mouth daily as needed (for acid reflux). 30 tablet 11   No current facility-administered medications on file prior to visit.     Allergies  Allergen Reactions  . Penicillins Other (See Comments)    Has patient had a PCN reaction causing immediate rash, facial/tongue/throat swelling, SOB or lightheadedness with hypotension:unsure Has patient had a PCN reaction causing severe rash involving mucus membranes or skin necrosis:unsure Has patient had a PCN reaction that required hospitalization:unsure Has patient had a PCN reaction occurring within the last 10 years:No If all of the above answers are "NO", then may proceed with Cephalosporin use.  Unsure of reaction    History  Alcohol Use No    History  Smoking Status  . Never Smoker  Smokeless Tobacco  . Never Used    Review of Systems  Constitutional: Negative.   HENT: Negative.   Eyes: Negative.   Respiratory: Negative.   Cardiovascular: Negative.   Gastrointestinal: Positive for blood in stool.  Genitourinary: Negative.   Musculoskeletal: Negative.   Skin: Negative.   Neurological: Negative.   Endo/Heme/Allergies: Negative.   Psychiatric/Behavioral: Negative.     Objective   Vitals:   08/30/16 0901  BP: (!) 145/70  Pulse: 65  Resp: 18  Temp: 98.5 F (36.9 C)    Physical Exam  Constitutional: He is oriented to person, place, and time. He appears distressed.  HENT:  Head: Normocephalic and atraumatic.  Cardiovascular: Normal rate, regular rhythm and normal heart sounds.  Exam reveals no friction rub.   No murmur heard. Pulmonary/Chest: Effort normal and breath sounds normal. He has no wheezes. He has no rales.  Abdominal: Soft. Bowel sounds are normal. He exhibits no distension. There is no tenderness. There is no rebound.  Genitourinary:   Genitourinary Comments: 3 large external hemorrhoids with one thrombosed. Rectal examination limited secondary to pain. Some blood noted on the gauze that is in the area.  Neurological: He is alert and oriented to person, place, and time.  Skin: Skin is warm and dry.  Vitals reviewed.   ER note reviewed.  Assessment   thrombosed hemorrhoids  Plan   Patient scheduled for extensive hemorrhoidectomy on 08/31/2016. The risks and benefits of the procedure including bleeding, infection, and recurrence of the hemorrhoidal disease were fully explained to the patient, who gave informed consent. Percocet has been prescribed for pain.

## 2016-08-30 NOTE — ED Provider Notes (Signed)
[facility] DEPT Provider Note   CSN: [id number] Arrival date & time: 08/30/16  0016     History   Chief Complaint Chief Complaint  Patient presents with  . Hemorrhoids    HPI Alan Leblanc is a 56 y.o. male.  Patient presents to the emergency room for evaluation of rectal pain. Patient reports that he has a history of external hemorrhoids. Today he has been noticing progressively worsening pain, now severe. He has multiple large hemorrhoids that have become painful. He has not noticed any rectal bleeding.      Past Medical History:  Diagnosis Date  . Anemia   . Broken neck ([place]) 2003  . Cholecystitis   . GERD (gastroesophageal reflux disease)   . Rib fractures 2003  . Skull fracture ([place]) 2003  . Sleep apnea    Does not use CPAP    Patient Active Problem List   Diagnosis Date Noted  . Constipation 09/22/2015  . Reflux esophagitis   . Hiatal hernia   . History of colonic polyps   . First degree hemorrhoids   . Hematochezia 04/15/2015  . GERD (gastroesophageal reflux disease) 04/15/2015  . Anemia 04/15/2015  . HEMORRHAGE OF RECTUM AND ANUS 10/16/2007  . ABDOMINAL PAIN, EPIGASTRIC 10/16/2007    Past Surgical History:  Procedure Laterality Date  . APPENDECTOMY    . CHOLECYSTECTOMY    . COLONOSCOPY    . COLONOSCOPY N/A 05/07/2015   Procedure: COLONOSCOPY;  Surgeon: Daneil Dolin, MD;  Location: [facility];  Service: Endoscopy;  Laterality: N/A;  0900  . ESOPHAGOGASTRODUODENOSCOPY N/A 05/07/2015   Procedure: ESOPHAGOGASTRODUODENOSCOPY (EGD);  Surgeon: Daneil Dolin, MD;  Location: [facility];  Service: Endoscopy;  Laterality: N/A;  . LEG SURGERY Right     right ankle fx  . VENTRAL HERNIA REPAIR N/A 12/07/2015   Procedure: HERNIA REPAIR VENTRAL ADULT WITH MESH;  Surgeon: Aviva Signs, MD;  Location: [facility];  Service: General;  Laterality: N/A;       Home Medications    Prior to Admission medications   Medication Sig Start Date End Date  Taking? Authorizing Provider  pantoprazole (PROTONIX) 40 MG tablet Take 1 tablet (40 mg total) by mouth daily as needed (for acid reflux). 03/21/16  Yes Carlis Stable, NP  docusate sodium (COLACE) 100 MG capsule Take 1 capsule (100 mg total) by mouth every 12 (twelve) hours. 08/30/16   Orpah Greek, MD  HYDROcodone-acetaminophen (NORCO/VICODIN) 5-325 MG tablet Take 1-2 tablets by mouth every 4 (four) hours as needed for moderate pain. Patient not taking: Reported on 03/21/2016 12/07/15 12/06/16  Aviva Signs, MD  traMADol (ULTRAM) 50 MG tablet Take 1 tablet (50 mg total) by mouth every 6 (six) hours as needed. 08/30/16   Orpah Greek, MD    Family History Family History  Problem Relation Age of Onset  . Osteoarthritis Mother   . Tuberculosis Father   . Colon cancer Neg Hx        Limited knowledge of family history    Social History Social History  Substance Use Topics  . Smoking status: Never Smoker  . Smokeless tobacco: Never Used  . Alcohol use No     Allergies   Penicillins   Review of Systems Review of Systems  Gastrointestinal: Positive for rectal pain.  All other systems reviewed and are negative.    Physical Exam Updated Vital Signs BP (!) 150/81 (BP Location: Right Arm)   Pulse 64   Temp 98.2 F (  36.8 C) (Oral)   Resp 18   Ht 5\' 7"  (1.702 m)   Wt 98 kg (216 lb)   SpO2 97%   BMI 33.83 kg/m   Physical Exam  Constitutional: He is oriented to person, place, and time. He appears well-developed and well-nourished. No distress.  HENT:  Head: Normocephalic and atraumatic.  Right Ear: Hearing normal.  Left Ear: Hearing normal.  Nose: Nose normal.  Mouth/Throat: Oropharynx is clear and moist and mucous membranes are normal.  Eyes: Pupils are equal, round, and reactive to light. Conjunctivae and EOM are normal.  Neck: Normal range of motion. Neck supple.  Cardiovascular: Regular rhythm, S1 normal and S2 normal.  Exam reveals no gallop and no  friction rub.   No murmur heard. Pulmonary/Chest: Effort normal and breath sounds normal. No respiratory distress. He exhibits no tenderness.  Abdominal: Soft. Normal appearance and bowel sounds are normal. There is no hepatosplenomegaly. There is no tenderness. There is no rebound, no guarding, no tenderness at McBurney's point and negative Murphy's sign. No hernia.  Genitourinary:  Genitourinary Comments: Multiple large external hemorrhoids mostly on the left side. Most central hemorrhoid is purple-, tense and tender, consistent with thrombosed hemorrhoid  Musculoskeletal: Normal range of motion.  Neurological: He is alert and oriented to person, place, and time. He has normal strength. No cranial nerve deficit or sensory deficit. Coordination normal. GCS eye subscore is 4. GCS verbal subscore is 5. GCS motor subscore is 6.  Skin: Skin is warm, dry and intact. No rash noted. No cyanosis.  Psychiatric: He has a normal mood and affect. His speech is normal and behavior is normal. Thought content normal.  Nursing note and vitals reviewed.    ED Treatments / Results  Labs (all labs ordered are listed, but only abnormal results are displayed) Labs Reviewed - No data to display  EKG  EKG Interpretation None       Radiology No results found.  Procedures .Marland KitchenIncision and Drainage Date/Time: 08/30/2016 1:41 AM Performed by: Orpah Greek Authorized by: Orpah Greek   Consent:    Consent obtained:  Verbal   Consent given by:  Patient   Risks discussed:  Bleeding and pain   Alternatives discussed:  Delayed treatment Universal protocol:    Procedure explained and questions answered to patient or proxy's satisfaction: yes     Site/side marked: yes     Immediately prior to procedure a time out was called: yes     Patient identity confirmed:  Verbally with patient Location:    Type:  External thrombosed hemorrhoid   Size:  1cm x 2cm   Location:  Anogenital    Anogenital location:  Perianal Pre-procedure details:    Skin preparation:  Betadine Anesthesia (see MAR for exact dosages):    Anesthesia method:  Topical application and local infiltration   Topical anesthetic:  Lidocaine gel   Local anesthetic:  Lidocaine 2% WITH epi Procedure type:    Complexity:  Simple Procedure details:    Needle aspiration: yes     Needle size:  18 G   Incision types:  Single straight   Scalpel blade:  15   Wound management:  Irrigated with saline   Drainage characteristics: large blood clot.   Wound treatment:  Wound left open Post-procedure details:    Patient tolerance of procedure:  Tolerated well, no immediate complications   (including critical care time)  Medications Ordered in ED Medications  lidocaine (XYLOCAINE) 2 % viscous mouth solution 15  mL (15 mLs Mouth/Throat Given 08/30/16 0058)  oxyCODONE-acetaminophen (PERCOCET/ROXICET) 5-325 MG per tablet 2 tablet (2 tablets Oral Given 08/30/16 0057)  lidocaine-EPINEPHrine (XYLOCAINE W/EPI) 2 %-1:200000 (PF) injection 10 mL (10 mLs Infiltration Given by Other 08/30/16 0141)     Initial Impression / Assessment and Plan / ED Course  I have reviewed the triage vital signs and the nursing notes.  Pertinent labs & imaging results that were available during my care of the patient were reviewed by me and considered in my medical decision making (see chart for details).     Patient presents to the ER for evaluation of rectal pain. Examination reveals multiple large external hemorrhoids, one of which was thrombosed. I recommended evacuation of the clot to the patient. After discussing risks and benefits he did consent to the procedure. Procedure was performed without difficulty. A large clot was evacuated, no complications. Patient will perform sitz bath, Colace twice a day, analgesia. Follow-up with general surgery.  Final Clinical Impressions(s) / ED Diagnoses   Final diagnoses:  Thrombosed external  hemorrhoid    New Prescriptions New Prescriptions   DOCUSATE SODIUM (COLACE) 100 MG CAPSULE    Take 1 capsule (100 mg total) by mouth every 12 (twelve) hours.   TRAMADOL (ULTRAM) 50 MG TABLET    Take 1 tablet (50 mg total) by mouth every 6 (six) hours as needed.     Orpah Greek, MD 08/30/16 [phone]

## 2016-08-30 NOTE — Patient Instructions (Signed)
Hemorroides (Hemorrhoids) Las hemorroides son venas inflamadas adentro o alrededor del recto o del ano. Las hemorroides pueden causar dolor, picazn o hemorragias. Generalmente no causan problemas graves. Con frecuencia mejoran al cambiar la dieta, el estilo de vida y otros tratamientos en el hogar. CUIDADOS EN EL HOGAR Comida y bebida  Consuma alimentos que contengan fibra, como cereales integrales, porotos, frutos secos, frutas y verduras. Pregntele a su mdico acerca de tomar productos con fibra aadida en ellos (complementos defibra).  Beba suficiente lquido para mantener el pis (orina) claro o de color amarillo plido. En caso de dolor e hinchazn  Tome un bao de agua tibia (bao de asiento) durante 20 minutos para aliviar el dolor. Hgalo 3 o 4veces al da.  Si se lo indican, aplique hielo sobre la zona adolorida. Puede ser beneficioso aplicar hielo entre los baos con agua tibia. ? Ponga el hielo en una bolsa plstica. ? Coloque una toalla entre la piel y la bolsa de hielo. ? Coloque el hielo durante 20 minutos, 2 a 3 veces por da. Instrucciones generales  Tome los medicamentos de venta libre y los recetados solamente como se lo haya indicado el mdico. ? Las cremas y medicamentos recetados que se colocan en el ano (supositorios) se pueden usar o aplicar como se lo hayan indicado.  Haga ejercicio fsico con frecuencia.  Vaya al bao cuando sienta la necesidad de defecar. No espere.  Evite hacer demasiada fuerza al defecar.  Mantenga la zona del ano limpia y seca. Use papel higinico hmedo o toallas de papel hmedas.  No pase mucho tiempo sentado en el inodoro. SOLICITE AYUDA SI:  Tiene alguno de estos sntomas: ? Dolor e hinchazn que no mejoran con el tratamiento o los medicamentos. ? Hemorragia que no se detiene. ? Dificultad para defecar o imposibilidad de hacerlo. ? Dolor o hinchazn en la zona exterior de las hemorroides. Esta informacin no tiene como fin  reemplazar el consejo del mdico. Asegrese de hacerle al mdico cualquier pregunta que tenga. Document Released: 04/23/2012 Document Revised: 04/20/2015 Document Reviewed: 09/10/2014 Elsevier Interactive Patient Education  2018 [employer]  

## 2016-08-30 NOTE — ED Triage Notes (Signed)
Pt c/o of rectal pain. States he has hemorrhoids.

## 2016-08-30 NOTE — Progress Notes (Signed)
Alan Leblanc [employer]; [id number]; 1960/04/21   HPI patient is a 56 year old Hispanic male who presented the emergency room last night with rectal pain secondary to thrombosed hemorrhoids. He states he has had hemorrhoidal disease in the past, but he started having significant pain over the last 24 hours. In the emergency room, he was noted to have large external hemorrhoids with one that was thrombosed. Initial attempt to was not very successful. He was referred to my care for further evaluation treatment. He states he has a pain of 10 out of 10.  Past Medical History:  Diagnosis Date  . Anemia   . Broken neck ([place]) 2003  . Cholecystitis   . GERD (gastroesophageal reflux disease)   . Rib fractures 2003  . Skull fracture ([place]) 2003  . Sleep apnea    Does not use CPAP    Past Surgical History:  Procedure Laterality Date  . APPENDECTOMY    . CHOLECYSTECTOMY    . COLONOSCOPY    . COLONOSCOPY N/A 05/07/2015   Procedure: COLONOSCOPY;  Surgeon: Daneil Dolin, MD;  Location[facility];  Service: Endoscopy;  Laterality: N/A;  0900  . ESOPHAGOGASTRODUODENOSCOPY N/A 05/07/2015   Procedure: ESOPHAGOGASTRODUODENOSCOPY (EGD);  Surgeon: Daneil Dolin, MD;  Location: [facility];  Service: Endoscopy;  Laterality: N/A;  . LEG SURGERY Right     right ankle fx  . VENTRAL HERNIA REPAIR N/A 12/07/2015   Procedure: HERNIA REPAIR VENTRAL ADULT WITH MESH;  Surgeon: Aviva Signs, MD;  Location: [facility];  Service: General;  Laterality: N/A;    Family History  Problem Relation Age of Onset  . Osteoarthritis Mother   . Tuberculosis Father   . Colon cancer Neg Hx        Limited knowledge of family history    Current Outpatient Prescriptions on File Prior to Visit  Medication Sig Dispense Refill  . docusate sodium (COLACE) 100 MG capsule Take 1 capsule (100 mg total) by mouth every 12 (twelve) hours. 60 capsule 0  . HYDROcodone-acetaminophen (NORCO/VICODIN) 5-325 MG tablet Take 1-2 tablets by mouth every 4  (four) hours as needed for moderate pain. (Patient not taking: Reported on 03/21/2016) 50 tablet 0  . pantoprazole (PROTONIX) 40 MG tablet Take 1 tablet (40 mg total) by mouth daily as needed (for acid reflux). 30 tablet 11   No current facility-administered medications on file prior to visit.     Allergies  Allergen Reactions  . Penicillins Other (See Comments)    Has patient had a PCN reaction causing immediate rash, facial/tongue/throat swelling, SOB or lightheadedness with hypotension:unsure Has patient had a PCN reaction causing severe rash involving mucus membranes or skin necrosis:unsure Has patient had a PCN reaction that required hospitalization:unsure Has patient had a PCN reaction occurring within the last 10 years:No If all of the above answers are "NO", then may proceed with Cephalosporin use.  Unsure of reaction    History  Alcohol Use No    History  Smoking Status  . Never Smoker  Smokeless Tobacco  . Never Used    Review of Systems  Constitutional: Negative.   HENT: Negative.   Eyes: Negative.   Respiratory: Negative.   Cardiovascular: Negative.   Gastrointestinal: Positive for blood in stool.  Genitourinary: Negative.   Musculoskeletal: Negative.   Skin: Negative.   Neurological: Negative.   Endo/Heme/Allergies: Negative.   Psychiatric/Behavioral: Negative.     Objective   Vitals:   08/30/16 0901  BP: (!) 145/70  Pulse: 65  Resp: 18  Temp: 98.5 F (36.9 C)    Physical Exam  Constitutional: He is oriented to person, place, and time. He appears distressed.  HENT:  Head: Normocephalic and atraumatic.  Cardiovascular: Normal rate, regular rhythm and normal heart sounds.  Exam reveals no friction rub.   No murmur heard. Pulmonary/Chest: Effort normal and breath sounds normal. He has no wheezes. He has no rales.  Abdominal: Soft. Bowel sounds are normal. He exhibits no distension. There is no tenderness. There is no rebound.  Genitourinary:   Genitourinary Comments: 3 large external hemorrhoids with one thrombosed. Rectal examination limited secondary to pain. Some blood noted on the gauze that is in the area.  Neurological: He is alert and oriented to person, place, and time.  Skin: Skin is warm and dry.  Vitals reviewed.   ER note reviewed.  Assessment   thrombosed hemorrhoids  Plan   Patient scheduled for extensive hemorrhoidectomy on 08/31/2016. The risks and benefits of the procedure including bleeding, infection, and recurrence of the hemorrhoidal disease were fully explained to the patient, who gave informed consent. Percocet has been prescribed for pain.

## 2016-08-30 NOTE — Patient Instructions (Signed)
Alan Leblanc  08/30/2016                                                        Instrucciones Para Antes de la Ciruga   Su ciruga est programada para-(your procedure is scheduled on)  08/31/2016    Alan Leblanc - at 1150 am    Por favor llame al [phone] si tiene algn problema en la maana de la ciruga. (please call if you have any problems the morning of surgery.)                  Recuerde: (Remember)   No coma alimentos ni tome lquidos, incluyendo agua, despus de la medianoche del  (Do not eat food or drink liquids including water after midnight on  08/30/2016  Tome estas medicinas en la maana de la ciruga con un SORBITO de agua (take these meds the morning of surgery with a SIP of water)  Hydrocodone or oxycodone, colace and protonix.   Puede cepillarse los dientes en la maana de la [place]. (you may brush your teeth the morning of surgery)   No use joyas, maquillaje de ojos, lpiz labial, crema para el cuerpo o esmalte de uas oscuro. (Do not wear jewelry, eye makeup, lipstick, body lotion, or dark fingernail polish)   No puede usar desodorante. (you may wear deodorant)   Si va a ser ingresado despues de la ciruga, deje la maleta en el carro hasta que se le haya asignado una habitacin. (If you are to be admitted after surgery, leave suitcase in car until your room has been assigned.)   A los pacientes que se les d de alta el mismo da no se les permitir manejar a casa.  (Patients discharged on the day of surgery will not be allowed to drive home)   Use ropa suelta y cmoda de regreso a casa. (wear loose comfortable clothes for ride home)    Procedimientos quirrgicos para las hemorroides (Surgical Procedures for Hemorrhoids) Se pueden utilizar procedimientos quirrgicos para tratar las hemorroides. Las hemorroides son venas inflamadas que se encuentran dentro del recto (hemorroides internas) o alrededor del ano (hemorroides  externas). La causa de las hemorroides es el aumento de la presin en la zona anal, ya sea por un esfuerzo para defecar (estreimiento), por diarrea, por embarazo, por obesidad, por sexo anal o por permanecer sentado durante largos perodos. Las hemorroides pueden causar sntomas tales como dolor y [place]. Puede ser necesario realizar una ciruga si los cambios en la dieta, en el estilo de vida y otros tratamientos no [employer] sntomas. Se pueden utilizar diversos mtodos quirrgicos. Los tres mtodos ms frecuentes son los siguientes:  Hemorroidectoma cerrada. Las hemorroides se extirpan Facilities manager, y los cortes (incisiones) se cierran con puntos (suturas).  Hemorroidectoma abierta. Las hemorroides se extirpan Facilities manager, pero se deja que las incisiones cicatricen sin suturas.  Hemorroidopexia con grapas. Se extirpan las hemorroides con un dispositivo que retira un anillo de tejido sobrante. INFORME A SU MDICO:  Cualquier alergia que tenga.  Todos los [employer], incluidos vitaminas, hierbas, gotas oftlmicas, cremas y medicamentos de venta libre.  Problemas previos que usted o los [employer] de su familia hayan tenido con el uso de anestsicos.  [place]  que tenga.  Si tiene cirugas previas.  Cualquier enfermedad que tenga.  Si est embarazada o podra estarlo. RIESGOS Y COMPLICACIONES En general, se trata de un procedimiento seguro. Sin embargo, pueden presentarse problemas, por ejemplo:  Infeccin.  Hemorragia.  Reacciones alrgicas a los medicamentos.  Daos a Catering manager u otros rganos.  Dolor.  Estreimiento.  Tener dificultad para Garment/textile technologist.  Estrechamiento del conducto anal (estenosis).  Dificultad para controlar la defecacin (incontinencia). ANTES DEL PROCEDIMIENTO  Consulte a su mdico acerca de estos temas: ? Cambiar o suspender los medicamentos que toma habitualmente. Esto es muy importante si toma  medicamentos para la diabetes o anticoagulantes. ? Tomar medicamentos, como aspirina e ibuprofeno. Estos medicamentos pueden tener un efecto anticoagulante en la [place]. No tome estos medicamentos antes del procedimiento si el mdico le indica que no lo haga.  Tal vez deba someterse a un procedimiento para examinar el interior del colon con un endoscopio (colonoscopia). El mdico puede realizarlo para asegurarse de que el sangrado o el dolor no tengan otras causas.  Siga las indicaciones del mdico respecto de las restricciones para las comidas o las bebidas.  Pueden indicarle que tome un laxante y se haga un enema para limpiar el colon antes de la ciruga (preparacin del colon). Siga cuidadosamente las indicaciones del mdico acerca de la preparacin del colon.  Pregntele al mdico cmo se Scientist, clinical (histocompatibility and immunogenetics) o se Museum/gallery curator de la Leisure centre manager.  Pueden darle antibiticos para ayudar a prevenir las infecciones.  Haga planes para que una persona lo lleve de vuelta a su casa despus del procedimiento. PROCEDIMIENTO  Para reducir el riesgo de infecciones: ? El equipo mdico se lavar o se desinfectar las manos. ? Le lavarn la piel con jabn.  Le colocarn una va intravenosa (IV) en una de las venas.  Podrn administrarle uno o ms de los siguientes medicamentos: ? Un medicamento para ayudarlo a relajarse (sedante). ? Un medicamento para adormecer la zona (anestesia local). ? Un medicamento que lo har dormir (anestesia general). ? Un medicamento que se inyecta en una zona del cuerpo para adormecer toda la regin que se encuentra por debajo del lugar de la inyeccin (anestesia regional).  Pueden colocarle un gel lubricante en el recto.  El cirujano introducir un endoscopio corto (anoscopio) en el recto para examinar las hemorroides.  Se realizar uno de los siguientes procedimientos para extirpar las hemorroides. Hemorroidectoma cerrada  El cirujano utilizar instrumentos quirrgicos  para abrir el tejido que rodea las hemorroides.  Las venas que las irrigan se ligarn con una sutura.  Se extirparn las hemorroides.  El tejido que las rodea se cerrar con suturas que el cuerpo puede reabsorber (suturas reabsorbibles). Hemorroidectoma abierta  Se extirparn las hemorroides con instrumentos quirrgicos.  Se dejarn abiertas las incisiones para que cicatricen sin suturas. Hemorroidopexia con grapas  El cirujano utilizar una grapadora quirrgica circular para extirpar las hemorroides,  la cual se introducir en el ano. Esta permitir la extirpacin de un anillo circular de tejido que incluye tejido hemorroidal y parte del tejido que se encuentra por encima de las hemorroides.  Las grapas en el dispositivo cerrarn los bordes del tejido que se extirp. Esto interrumpir la irrigacin de sangre a las hemorroides y Publishing rights manager las hemorroides remanentes en Chief of Staff. Cada uno de estos procedimientos puede variar segn el mdico y el hospital. DESPUS DEL PROCEDIMIENTO  Marin Comment controlarn con frecuencia la presin arterial, la frecuencia cardaca, la frecuencia respiratoria y el nivel de oxgeno en la [place]  que hayan desaparecido los efectos de los medicamentos administrados.  Le darn medicamentos para el dolor si los necesita. Esta informacin no tiene Marine scientist el consejo del mdico. Asegrese de hacerle al mdico cualquier pregunta que tenga. Document Released: 10/17/2012 Document Revised: 09/17/2014 Document Reviewed: 03/24/2014 Elsevier Interactive Patient Education  2018 [place] para las hemorroides, Music therapist (Surgical Procedures for Hemorrhoids, Care After) Siga estas instrucciones durante las prximas semanas. Estas indicaciones le proporcionan informacin acerca de cmo deber cuidarse despus del procedimiento. El mdico tambin podr darle instrucciones ms especficas. El tratamiento ha sido planificado  segn las prcticas mdicas actuales, pero en algunos casos pueden ocurrir problemas. Comunquese con el mdico si tiene algn problema o dudas despus del procedimiento. QU ESPERAR DESPUS DEL PROCEDIMIENTO Despus del procedimiento, es comn [employer] siguientes sntomas:  Dolor rectal.  Dolor al defecar.  Sangrado rectal leve. [place] los medicamentos de venta libre y los recetados solamente como se lo haya indicado el mdico.  No conduzca ni opere maquinaria pesada mientras toma analgsicos recetados.  Tome un ablandador de heces o un laxante formador de [employer] como se lo haya indicado el mdico. Actividades  Haga reposo en su hogar. Reanude sus actividades normales como se lo haya indicado el mdico.  No levante ningn objeto que pese ms de 10libras (4,5kg).  No permanezca sentado por largos perodos. Haga una caminata diaria o como se lo haya indicado el mdico.  No haga fuerza para defecar. No pase mucho tiempo sentado en el inodoro. Comida y bebida  Consuma alimentos que contengan fibra, como cereales integrales, frijoles, frutos secos, frutas y verduras.  Beba suficiente lquido para Consulting civil engineer orina clara o de color amarillo plido. Instrucciones generales  Hgase baos de asiento con agua tibia 2 o 3veces por da para Best boy o la picazn.  Concurra a todas las visitas de control como se lo haya indicado el mdico. Esto es importante. SOLICITE ATENCIN MDICA SI:  El medicamento no Production designer, theatre/television/film.  Tiene fiebre o siente escalofros.  Tiene estreimiento.  Tiene dificultades para orinar. SOLICITE ATENCIN MDICA DE INMEDIATO SI:  Siente mucho dolor rectal.  Tiene hemorragia rectal abundante. Esta informacin no tiene Marine scientist el consejo del mdico. Asegrese de hacerle al mdico cualquier pregunta que tenga. Document Released: 12/27/2004 Document Revised: 09/17/2014 Document  Reviewed: 03/24/2014 Elsevier Interactive Patient Education  2018 [place] general en los adultos (General Anesthesia, Adult) La anestesia general es el uso de medicamentos para dormir a Furniture conservator/restorer persona (dejarla inconsciente) para una intervencin [place]. La anestesia general suele recomendarse cuando una ciruga:  Va a durar [employer].  Requiere que se quede quieto o que est en una posicin inusual.  Es mayor y puede provocar que pierda [place].  Es imposible de Optometrist sin anestesia general. Los medicamentos utilizados para la anestesia general se llaman anestsicos generales. Adems de mantenerlo dormido, estos medicamentos:  Teacher, music.  Controlan la presin arterial.  Relajan los msculos. INFORME A SU MDICO:  Cualquier alergia que tenga.  Todos los [employer], incluidos vitaminas, hierbas, gotas oftlmicas, cremas y medicamentos de venta libre.  Cualquier problema que usted o sus familiares hayan tenido con la anestesia.  Tipos de anestsicos que le hayan dado en el pasado.  Cualquier trastorno hemorrgico que tenga.  Cirugas previas.  Cualquier enfermedad que tenga.  Cualquier antecedente de afecciones cardacas o pulmonares, como insuficiencia cardaca,  apnea del sueo o enfermedad pulmonar obstructiva crnica (EPOC).  Si est embarazada o podra estarlo.  Si consume tabaco, alcohol, marihuana o drogas.  Antecedentes de [employer].  Antecedentes de depresin o ansiedad. RIESGOS Y COMPLICACIONES En general, se trata de un procedimiento seguro. Sin embargo, pueden presentarse problemas, por ejemplo:  Reaccin alrgica a la anestesia.  Problemas cardacos o pulmonares.  Inhalar alimentos o lquidos del estmago a los pulmones (aspiracin).  Lesin en los nervios.  Despertarse durante la [place] y no poder moverse (raro).  Nerviosismo extremo o estado de confusin mental (delirio) al despertarse de la  anestesia.  Aire en el torrente sanguneo, que puede provocar un ictus. Es ms probable que estos problemas surjan en caso de que le realicen una ciruga mayor o si tiene una enfermedad terminal. Puede evitar algunas de estas complicaciones respondiendo a todas las preguntas del mdico concienzudamente y siguiendo todas las indicaciones previas a la [place]. La anestesia general puede causar efectos secundarios, como por ejemplo:  Nuseas o vmitos  Dolor de garganta causado por el tubo endotraqueal.  Fro o escalofros.  Sentirse cansado, sin fuerzas o dolorido.  Sueo o somnolencia.  Confusin o nerviosismo. ANTES DEL PROCEDIMIENTO Mantenerse hidratado Siga las indicaciones del mdico acerca de la hidratacin, las cuales pueden incluir lo siguiente:  Hasta 2horas antes del procedimiento, puede beber lquidos transparentes, como agua, jugos frutales transparentes, caf negro y t solo. Restricciones en las comidas y bebidas [place] comidas y las bebidas, las cuales pueden incluir lo siguiente:  Ocho horas antes del procedimiento, deje de ingerir comidas o alimentos pesados, por ejemplo, carne, alimentos fritos o alimentos grasos.  Seis horas antes del procedimiento, deje de ingerir comidas o alimentos livianos, como tostadas o cereales.  Seis horas antes del procedimiento, deje de beber [place] o bebidas que [employer].  Dos horas antes del procedimiento, deje de beber lquidos transparentes. Medicamentos  Consulte al mdico si debe hacer o no lo siguiente: ? Cambiar o suspender los medicamentos que toma habitualmente. Esto es muy importante si toma medicamentos para la diabetes o anticoagulantes. ? Tomar medicamentos como aspirina e ibuprofeno. Estos medicamentos pueden tener un efecto anticoagulante en la [place]. No tome estos medicamentos antes del procedimiento si el mdico le indica que no lo haga. ? Tomar complementos  alimenticios o medicamentos nuevos. No tome estos medicamentos durante la semana anterior a la ciruga, a menos que el mdico lo autorice.  Si le indican que debe tomar un medicamento o que debe continuar tomando un medicamento el da de la [place], tmelo con sorbos de [place]. Instrucciones generales  Pregunte si volver a su [place], o si debe quedarse en el hospital por [employer]. ? Pdale a alguien que lo lleve a su casa. ? Pdale a alguien que se quede con usted durante las primeras 24horas despus de salir del hospital o de la clnica.  No consuma ningn producto que contenga tabaco, como cigarrillos, tabaco de Higher education careers adviser y Psychologist, sport and exercise, durante al menos 3 a 6semanas antes de la [place].  Puede lavarse los dientes la maana de la [place], pero asegrese de escupir la pasta de dientes. PROCEDIMIENTO  Le aplicarn la anestesia con Judene Companion y por va intravenosa (IV).  Es posible que le den un medicamento para ayudarlo a Nurse, children's (sedante).  Una vez que est dormido, es posible que le coloquen un tubo para ayudarlo a Ambulance person.  Un Physicist, medical con  usted durante toda la [place]. Continuar dndole los medicamentos que necesita y Secretary/administrator la dosis para mantenerlo cmodo y Engineer, maintenance. Tambin le controlarn la presin arterial, el pulso y los niveles de oxgeno para asegurarse de que no tenga ningn problema.  Si le colocaron un tubo para ayudarlo a Ambulance person, se lo quitarn antes de que se despierte. Este procedimiento puede variar segn el mdico y el hospital. DESPUS DEL PROCEDIMIENTO  Cuando termine la ciruga, por lo general, se despertar lentamente y en una sala de reanimacin.  Le controlarn la presin arterial, la frecuencia cardaca, la frecuencia respiratoria y Retail buyer de oxgeno en la sangre hasta que haya desaparecido el efecto de los medicamentos administrados.  Es posible que le den [employer] lo ayuden a calmarse si  se siente nervioso o ansioso.  Si se ir a su casa el mismo da, el mdico verificar si puede pararse, beber y Garment/textile technologist.  El mdico tambin tratar Conservation officer, historic buildings y los efectos secundarios antes de que se vaya a su casa.  No conduzca durante 24horas si le administraron un sedante.  Es posible que tenga o sienta lo siguiente: ? Nuseas o vmitos. ? Dolor de Investment banker, operational. ? Lentitud mental. ? Fro o escalofros. ? Somnolencia. ? Cansancio. ? Dolor o inflamacin, incluso en partes del cuerpo no afectadas por la ciruga. Esta informacin no tiene Marine scientist el consejo del mdico. Asegrese de hacerle al mdico cualquier pregunta que tenga. Document Released: 04/28/2010 Document Revised: 01/17/2014 Document Reviewed: 12/11/2014 Elsevier Interactive Patient Education  2018 [place] general en los adultos, cuidados posteriores (General Anesthesia, Adult, Care After) Estas indicaciones le proporcionan informacin acerca de cmo deber cuidarse despus del procedimiento. El mdico tambin podr darle instrucciones ms especficas. El tratamiento ha sido planificado segn las prcticas mdicas actuales, pero en algunos casos pueden ocurrir problemas. Comunquese con el mdico si tiene algn problema o dudas despus del procedimiento. QU ESPERAR DESPUS DEL PROCEDIMIENTO Despus del procedimiento, es comn Abbott Laboratories siguientes sntomas:  Vmitos.  Dolor de Investment banker, operational.  Lentitud mental. Es normal sentir lo siguiente:  Nuseas.  Fro o escalofros.  Somnolencia.  Cansancio.  Dolor o inflamacin, incluso en partes del cuerpo no afectadas por la ciruga. INSTRUCCIONES PARA EL CUIDADO EN EL HOGAR Durante al menos 24horas despus del procedimiento:  No haga lo siguiente: ? Participar en actividades que impliquen posibles cadas o lesiones. ? Conducir vehculos. ? Operar maquinarias pesadas. ? Beber alcohol. ? Tomar somnferos o medicamentos que causen  somnolencia. ? Firmar documentos legales ni tomar [employer]. ? Cuidar a nios por su cuenta.  Hacer reposo. Comida y bebida  Si vomita, tome agua, jugo o sopa una vez que pueda beber sin vomitar.  Beba suficiente lquido para Consulting civil engineer orina clara o de color amarillo plido.  Asegrese de no tener nuseas antes de ingerir alimentos slidos.  Siga la dieta recomendada por el mdico. Instrucciones generales  Permanezca con un adulto responsable hasta que est completamente despierto y consciente.  Retome sus actividades normales como se lo haya indicado el mdico. Pregntele al mdico qu actividades son seguras para usted.  Tome los medicamentos de venta libre y los recetados solamente como se lo haya indicado el mdico.  Si fuma, no lo haga sin supervisin.  Concurra a todas las visitas de control como se lo haya indicado el mdico. Esto es importante. SOLICITE ATENCIN MDICA SI:  Contina con nuseas o vmitos en su casa, y los medicamentos no ayudan.  No puede beber lquidos  ni volver a comer.  No puede orinar despus de 8 a 12horas.  Tiene una erupcin cutnea.  Tiene fiebre.  Tiene cada vez ms enrojecimiento en la zona de la ciruga. SOLICITE ATENCIN MDICA DE INMEDIATO SI:  Tiene dificultad para respirar.  Siente dolor en el pecho.  Tiene una hemorragia imprevista.  Siente que tiene un problema potencialmente mortal o urgente. Esta informacin no tiene Marine scientist el consejo del mdico. Asegrese de hacerle al mdico cualquier pregunta que tenga. Document Released: 12/27/2004 Document Revised: 01/17/2014 Document Reviewed: 12/11/2014 Elsevier Interactive Patient Education  2018 [employer].     @[email or web address] @   Your procedure is scheduled on  08/31/2016   Report to [facility] at  1150  A.M.  Call this number if you have problems the morning of surgery:  [phone]   Remember:  Do not eat food or drink liquids after  midnight.  Take these medicines the morning of surgery with A SIP OF WATER  Hydrocodone or oxycodone, colace, protonix.   Do not wear jewelry, make-up or nail polish.  Do not wear lotions, powders, or perfumes, or deoderant.  Do not shave 48 hours prior to surgery.  Men may shave face and neck.  Do not bring valuables to the hospital.  [facility] is not responsible for any belongings or valuables.  Contacts, dentures or bridgework may not be worn into surgery.  Leave your suitcase in the car.  After surgery it may be brought to your room.  For patients admitted to the hospital, discharge time will be determined by your treatment team.  Patients discharged the day of surgery will not be allowed to drive home.   Name and phone number of your driver:   family Special instructions:  None  Please read over the following fact sheets that you were given. Anesthesia Post-op Instructions and Care and Recovery After Surgery      Surgical Procedures for Hemorrhoids Surgical procedures can be used to treat hemorrhoids. Hemorrhoids are swollen veins that are inside the rectum (internal hemorrhoids) or around the anus (external hemorrhoids). They are caused by increased pressure in the anal area. This pressure may result from straining to have a bowel movement (constipation), diarrhea, pregnancy, obesity, anal sex, or sitting for long periods of time. Hemorrhoids can cause symptoms such as pain and bleeding. Surgery may be needed if diet changes, lifestyle changes, and other treatments do not help your symptoms. Various surgical methods may be used. Three common methods are:  Closed hemorrhoidectomy. The hemorrhoids are surgically removed, and the surgical cuts (incisions) are closed with stitches (sutures).  Open hemorrhoidectomy. The hemorrhoids are surgically removed, but the incisions are allowed to heal without sutures.  Stapled hemorrhoidopexy. The hemorrhoids are removed using a device that  takes out a ring of excess tissue.  Tell a health care provider about:  Any allergies you have.  All medicines you are taking, including vitamins, herbs, eye drops, creams, and over-the-counter medicines.  Any problems you or family members have had with anesthetic medicines.  Any blood disorders you have.  Any surgeries you have had.  Any medical conditions you have.  Whether you are pregnant or may be pregnant. What are the risks? Generally, this is a safe procedure. However, problems may occur, including:  Infection.  Bleeding.  Allergic reactions to medicines.  Damage to other structures or organs.  Pain.  Constipation.  Difficulty passing urine.  Narrowing of the anal canal (stenosis).  Difficulty controlling bowel movements (incontinence).  What happens before the procedure?  Ask your health care provider about: ? Changing or stopping your regular medicines. This is especially important if you are taking diabetes medicines or blood thinners. ? Taking medicines such as aspirin and ibuprofen. These medicines can thin your blood. Do not take these medicines before your procedure if your health care provider instructs you not to.  You may need to have a procedure to examine the inside of your colon with a scope (colonoscopy). Your health care provider may do this to make sure that there are no other causes for your bleeding or pain.  Follow instructions from your health care provider about eating or drinking restrictions.  You may be instructed to take a laxative and an enema to clean out your colon before surgery (bowel prep). Carefully follow instructions from your health care provider about bowel prep.  Ask your health care provider how your surgical site will be marked or identified.  You may be given antibiotic medicine to help prevent infection.  Plan to have someone take you home after the procedure. What happens during the procedure?  To reduce your  risk of infection: ? Your health care team will wash or sanitize their hands. ? Your skin will be washed with soap.  An IV tube will be inserted into one of your veins.  You will be given one or more of the following: ? A medicine to help you relax (sedative). ? A medicine to numb the area (local anesthetic). ? A medicine to make you fall asleep (general anesthetic). ? A medicine that is injected into an area of your body to numb everything below the injection site (regional anesthetic).  A lubricating jelly may be placed into your rectum.  Your surgeon will insert a short scope (anoscope) into your rectum to examine the hemorrhoids.  One of the following hemorrhoid procedures will be performed. Closed Hemorrhoidectomy  Your surgeon will use surgical instruments to open the tissue around the hemorrhoids.  The veins that supply the hemorrhoids will be tied off with a suture.  The hemorrhoids will be removed.  The tissue that surrounds the hemorrhoids will be closed with sutures that your body can absorb (absorbable sutures). Open Hemorrhoidectomy  The hemorrhoids will be removed with surgical instruments.  The incisions will be left open to heal without sutures. Stapled Hemorrhoidopexy  Your surgeon will use a circular stapling device to remove the hemorrhoids.  The device will be inserted into your anus. It will remove a circular ring of tissue that includes hemorrhoid tissue and some tissue above the hemorrhoids.  The staples in the device will close the edges of removed tissue. This will cut off the blood supply to the hemorrhoids and will pull any remaining hemorrhoids back into place. Each of these procedures may vary among health care providers and hospitals. What happens after the procedure?  Your blood pressure, heart rate, breathing rate, and blood oxygen level will be monitored often until the medicines you were given have worn off.  You will be given pain medicine  as needed. This information is not intended to replace advice given to you by your health care provider. Make sure you discuss any questions you have with your health care provider. Document Released: 10/24/2008 Document Revised: 06/04/2015 Document Reviewed: 03/24/2014 Elsevier Interactive Patient Education  2018 [employer]. Surgical Procedures for Hemorrhoids, Care After Refer to this sheet in the next few weeks. These instructions provide you with information about caring for yourself after your  procedure. Your health care provider may also give you more specific instructions. Your treatment has been planned according to current medical practices, but problems sometimes occur. Call your health care provider if you have any problems or questions after your procedure. What can I expect after the procedure? After the procedure, it is common to have:  Rectal pain.  Pain when you are having a bowel movement.  Slight rectal bleeding.  Follow these instructions at home: Medicines  Take over-the-counter and prescription medicines only as told by your health care provider.  Do not drive or operate heavy machinery while taking prescription pain medicine.  Use a stool softener or a bulk laxative as told by your health care provider. Activity  Rest at home. Return to your normal activities as told by your health care provider.  Do not lift anything that is heavier than 10 lb (4.5 kg).  Do not sit for long periods of time. Take a walk every day or as told by your health care provider.  Do not strain to have a bowel movement. Do not spend a long time sitting on the toilet. Eating and drinking  Eat foods that contain fiber, such as whole grains, beans, nuts, fruits, and vegetables.  Drink enough fluid to keep your urine clear or pale yellow. General instructions  Sit in a warm bath 2-3 times per day to relieve soreness or itching.  Keep all follow-up visits as told by your health care  provider. This is important. Contact a health care provider if:  Your pain medicine is not helping.  You have a fever or chills.  You become constipated.  You have trouble passing urine. Get help right away if:  You have very bad rectal pain.  You have heavy bleeding from your rectum. This information is not intended to replace advice given to you by your health care provider. Make sure you discuss any questions you have with your health care provider. Document Released: 03/19/2003 Document Revised: 06/04/2015 Document Reviewed: 03/24/2014 Elsevier Interactive Patient Education  2018 [place] Anesthesia, Adult General anesthesia is the use of medicines to make a person "go to sleep" (be unconscious) for a medical procedure. General anesthesia is often recommended when a procedure:  Is long.  Requires you to be still or in an unusual position.  Is major and can cause you to lose blood.  Is impossible to do without general anesthesia.  The medicines used for general anesthesia are called general anesthetics. In addition to making you sleep, the medicines:  Prevent pain.  Control your blood pressure.  Relax your muscles.  Tell a health care provider about:  Any allergies you have.  All medicines you are taking, including vitamins, herbs, eye drops, creams, and over-the-counter medicines.  Any problems you or family members have had with anesthetic medicines.  Types of anesthetics you have had in the past.  Any bleeding disorders you have.  Any surgeries you have had.  Any medical conditions you have.  Any history of heart or lung conditions, such as heart failure, sleep apnea, or chronic obstructive pulmonary disease (COPD).  Whether you are pregnant or may be pregnant.  Whether you use tobacco, alcohol, marijuana, or street drugs.  Any history of Armed forces logistics/support/administrative officer.  Any history of depression or anxiety. What are the risks? Generally, this is  a safe procedure. However, problems may occur, including:  Allergic reaction to anesthetics.  Lung and heart problems.  Inhaling food or liquids from your stomach  into your lungs (aspiration).  Injury to nerves.  Waking up during your procedure and being unable to move (rare).  Extreme agitation or a state of mental confusion (delirium) when you wake up from the anesthetic.  Air in the bloodstream, which can lead to stroke.  These problems are more likely to develop if you are having a major surgery or if you have an advanced medical condition. You can prevent some of these complications by answering all of your health care provider's questions thoroughly and by following all pre-procedure instructions. General anesthesia can cause side effects, including:  Nausea or vomiting  A sore throat from the breathing tube.  Feeling cold or shivery.  Feeling tired, washed out, or achy.  Sleepiness or drowsiness.  Confusion or agitation.  What happens before the procedure? Staying hydrated Follow instructions from your health care provider about hydration, which may include:  Up to 2 hours before the procedure - you may continue to drink clear liquids, such as water, clear fruit juice, black coffee, and plain tea.  Eating and drinking restrictions Follow instructions from your health care provider about eating and drinking, which may include:  8 hours before the procedure - stop eating heavy meals or foods such as meat, fried foods, or fatty foods.  6 hours before the procedure - stop eating light meals or foods, such as toast or cereal.  6 hours before the procedure - stop drinking milk or drinks that contain milk.  2 hours before the procedure - stop drinking clear liquids.  Medicines  Ask your health care provider about: ? Changing or stopping your regular medicines. This is especially important if you are taking diabetes medicines or blood thinners. ? Taking medicines  such as aspirin and ibuprofen. These medicines can thin your blood. Do not take these medicines before your procedure if your health care provider instructs you not to. ? Taking new dietary supplements or medicines. Do not take these during the week before your procedure unless your health care provider approves them.  If you are told to take a medicine or to continue taking a medicine on the day of the procedure, take the medicine with sips of water. General instructions   Ask if you will be going home the same day, the following day, or after a longer hospital stay. ? Plan to have someone take you home. ? Plan to have someone stay with you for the first 24 hours after you leave the hospital or clinic.  For 3-6 weeks before the procedure, try not to use any tobacco products, such as cigarettes, chewing tobacco, and e-cigarettes.  You may brush your teeth on the morning of the procedure, but make sure to spit out the toothpaste. What happens during the procedure?  You will be given anesthetics through a mask and through an IV tube in one of your veins.  You may receive medicine to help you relax (sedative).  As soon as you are asleep, a breathing tube may be used to help you breathe.  An anesthesia specialist will stay with you throughout the procedure. He or she will help keep you comfortable and safe by continuing to give you medicines and adjusting the amount of medicine that you get. He or she will also watch your blood pressure, pulse, and oxygen levels to make sure that the anesthetics do not cause any problems.  If a breathing tube was used to help you breathe, it will be removed before you wake up. The procedure  may vary among health care providers and hospitals. What happens after the procedure?  You will wake up, often slowly, after the procedure is complete, usually in a recovery area.  Your blood pressure, heart rate, breathing rate, and blood oxygen level will be monitored  until the medicines you were given have worn off.  You may be given medicine to help you calm down if you feel anxious or agitated.  If you will be going home the same day, your health care provider may check to make sure you can stand, drink, and urinate.  Your health care providers will treat your pain and side effects before you go home.  Do not drive for 24 hours if you received a sedative.  You may: ? Feel nauseous and vomit. ? Have a sore throat. ? Have mental slowness. ? Feel cold or shivery. ? Feel sleepy. ? Feel tired. ? Feel sore or achy, even in parts of your body where you did not have surgery. This information is not intended to replace advice given to you by your health care provider. Make sure you discuss any questions you have with your health care provider. Document Released: 04/05/2007 Document Revised: 06/09/2015 Document Reviewed: 12/11/2014 Elsevier Interactive Patient Education  2018 [place] Anesthesia, Adult, Care After These instructions provide you with information about caring for yourself after your procedure. Your health care provider may also give you more specific instructions. Your treatment has been planned according to current medical practices, but problems sometimes occur. Call your health care provider if you have any problems or questions after your procedure. What can I expect after the procedure? After the procedure, it is common to have:  Vomiting.  A sore throat.  Mental slowness.  It is common to feel:  Nauseous.  Cold or shivery.  Sleepy.  Tired.  Sore or achy, even in parts of your body where you did not have surgery.  Follow these instructions at home: For at least 24 hours after the procedure:  Do not: ? Participate in activities where you could fall or become injured. ? Drive. ? Use heavy machinery. ? Drink alcohol. ? Take sleeping pills or medicines that cause drowsiness. ? Make important decisions or  sign legal documents. ? Take care of children on your own.  Rest. Eating and drinking  If you vomit, drink water, juice, or soup when you can drink without vomiting.  Drink enough fluid to keep your urine clear or pale yellow.  Make sure you have little or no nausea before eating solid foods.  Follow the diet recommended by your health care provider. General instructions  Have a responsible adult stay with you until you are awake and alert.  Return to your normal activities as told by your health care provider. Ask your health care provider what activities are safe for you.  Take over-the-counter and prescription medicines only as told by your health care provider.  If you smoke, do not smoke without supervision.  Keep all follow-up visits as told by your health care provider. This is important. Contact a health care provider if:  You continue to have nausea or vomiting at home, and medicines are not helpful.  You cannot drink fluids or start eating again.  You cannot urinate after 8-12 hours.  You develop a skin rash.  You have fever.  You have increasing redness at the site of your procedure. Get help right away if:  You have difficulty breathing.  You have chest pain.  You have unexpected bleeding.  You feel that you are having a life-threatening or urgent problem. This information is not intended to replace advice given to you by your health care provider. Make sure you discuss any questions you have with your health care provider. Document Released: 04/04/2000 Document Revised: 06/01/2015 Document Reviewed: 12/11/2014 Elsevier Interactive Patient Education  [employer].

## 2016-08-31 ENCOUNTER — Encounter (HOSPITAL_COMMUNITY)
Admission: RE | Disposition: A | Discharge status: Home / Self Care | Payer: Self-pay | Source: Ambulatory Visit | Attending: General Surgery

## 2016-08-31 ENCOUNTER — Ambulatory Visit (HOSPITAL_COMMUNITY): Payer: BLUE CROSS/BLUE SHIELD | Admitting: Anesthesiology

## 2016-08-31 ENCOUNTER — Ambulatory Visit (HOSPITAL_COMMUNITY)
Admission: RE | Admit: 2016-08-31 | Discharge: 2016-08-31 | Disposition: A | Discharge status: Home / Self Care | Payer: BLUE CROSS/BLUE SHIELD | Source: Ambulatory Visit | Attending: General Surgery | Admitting: General Surgery

## 2016-08-31 ENCOUNTER — Encounter (HOSPITAL_COMMUNITY): Payer: Self-pay | Admitting: *Deleted

## 2016-08-31 DIAGNOSIS — K219 Gastro-esophageal reflux disease without esophagitis: Secondary | ICD-10-CM | POA: Insufficient documentation

## 2016-08-31 DIAGNOSIS — K645 Perianal venous thrombosis: Secondary | ICD-10-CM | POA: Diagnosis not present

## 2016-08-31 DIAGNOSIS — K643 Fourth degree hemorrhoids: Secondary | ICD-10-CM

## 2016-08-31 DIAGNOSIS — E669 Obesity, unspecified: Secondary | ICD-10-CM | POA: Diagnosis not present

## 2016-08-31 DIAGNOSIS — K649 Unspecified hemorrhoids: Secondary | ICD-10-CM | POA: Diagnosis not present

## 2016-08-31 DIAGNOSIS — G4730 Sleep apnea, unspecified: Secondary | ICD-10-CM | POA: Insufficient documentation

## 2016-08-31 HISTORY — PX: HEMORRHOID SURGERY: SHX153

## 2016-08-31 SURGERY — HEMORRHOIDECTOMY
Anesthesia: General

## 2016-08-31 MED ORDER — CHLORHEXIDINE GLUCONATE CLOTH 2 % EX PADS: 6.0000 | MEDICATED_PAD | Freq: Once | CUTANEOUS | Status: DC

## 2016-08-31 MED ORDER — SODIUM CHLORIDE 0.9 % IR SOLN
Status: DC | PRN
  Administered 2016-08-31: 1000 mL

## 2016-08-31 MED ORDER — MIDAZOLAM HCL 2 MG/2ML IJ SOLN
1.0000 mg | Freq: Once | INTRAMUSCULAR | Status: AC | PRN
  Administered 2016-08-31: 2 mg via INTRAVENOUS

## 2016-08-31 MED ORDER — BUPIVACAINE LIPOSOME 1.3 % IJ SUSP
INTRAMUSCULAR | Status: DC | PRN
  Administered 2016-08-31: 13 mL

## 2016-08-31 MED ORDER — FENTANYL CITRATE (PF) 100 MCG/2ML IJ SOLN
INTRAMUSCULAR | Status: DC | PRN
  Administered 2016-08-31: 100 ug via INTRAVENOUS

## 2016-08-31 MED ORDER — FENTANYL CITRATE (PF) 100 MCG/2ML IJ SOLN
INTRAMUSCULAR | Status: AC
  Filled 2016-08-31: qty 2

## 2016-08-31 MED ORDER — SUCCINYLCHOLINE CHLORIDE 20 MG/ML IJ SOLN
INTRAMUSCULAR | Status: DC | PRN
  Administered 2016-08-31: 120 mg via INTRAVENOUS

## 2016-08-31 MED ORDER — ROCURONIUM BROMIDE 100 MG/10ML IV SOLN
INTRAVENOUS | Status: DC | PRN
  Administered 2016-08-31: 5 mg via INTRAVENOUS

## 2016-08-31 MED ORDER — LACTATED RINGERS IV SOLN
INTRAVENOUS | Status: DC
  Administered 2016-08-31: 13:00:00 via INTRAVENOUS

## 2016-08-31 MED ORDER — PROPOFOL 10 MG/ML IV BOLUS
INTRAVENOUS | Status: DC | PRN
  Administered 2016-08-31: 150 mg via INTRAVENOUS
  Administered 2016-08-31: 30 mg via INTRAVENOUS
  Administered 2016-08-31: 20 mg via INTRAVENOUS

## 2016-08-31 MED ORDER — ACETAMINOPHEN 325 MG PO TABS
ORAL_TABLET | ORAL | Status: AC
  Filled 2016-08-31: qty 2

## 2016-08-31 MED ORDER — LIDOCAINE VISCOUS 2 % MT SOLN
OROMUCOSAL | Status: DC | PRN
  Administered 2016-08-31: 1

## 2016-08-31 MED ORDER — CIPROFLOXACIN IN D5W 400 MG/200ML IV SOLN
400.0000 mg | INTRAVENOUS | Status: AC
  Administered 2016-08-31: 400 mg via INTRAVENOUS
  Filled 2016-08-31: qty 200

## 2016-08-31 MED ORDER — GLYCOPYRROLATE 0.2 MG/ML IJ SOLN
INTRAMUSCULAR | Status: DC | PRN
  Administered 2016-08-31: 0.2 mg via INTRAVENOUS

## 2016-08-31 MED ORDER — METRONIDAZOLE IN NACL 5-0.79 MG/ML-% IV SOLN
500.0000 mg | INTRAVENOUS | Status: AC
  Administered 2016-08-31: 500 mg via INTRAVENOUS
  Filled 2016-08-31: qty 100

## 2016-08-31 MED ORDER — KETOROLAC TROMETHAMINE 30 MG/ML IJ SOLN
INTRAMUSCULAR | Status: AC
  Filled 2016-08-31: qty 1

## 2016-08-31 MED ORDER — MIDAZOLAM HCL 2 MG/2ML IJ SOLN
INTRAMUSCULAR | Status: AC
  Filled 2016-08-31: qty 2

## 2016-08-31 MED ORDER — GLYCOPYRROLATE 0.2 MG/ML IJ SOLN
INTRAMUSCULAR | Status: AC
  Filled 2016-08-31: qty 1

## 2016-08-31 MED ORDER — BUPIVACAINE LIPOSOME 1.3 % IJ SUSP
INTRAMUSCULAR | Status: AC
  Filled 2016-08-31: qty 20

## 2016-08-31 MED ORDER — ACETAMINOPHEN 325 MG PO TABS
650.0000 mg | ORAL_TABLET | Freq: Four times a day (QID) | ORAL | Status: DC | PRN
  Administered 2016-08-31: 650 mg via ORAL

## 2016-08-31 MED ORDER — LIDOCAINE VISCOUS 2 % MT SOLN
OROMUCOSAL | Status: AC
  Filled 2016-08-31: qty 15

## 2016-08-31 MED ORDER — KETOROLAC TROMETHAMINE 30 MG/ML IJ SOLN
30.0000 mg | Freq: Once | INTRAMUSCULAR | Status: AC
  Administered 2016-08-31: 30 mg via INTRAVENOUS

## 2016-08-31 MED ORDER — FENTANYL CITRATE (PF) 100 MCG/2ML IJ SOLN
50.0000 ug | Freq: Once | INTRAMUSCULAR | Status: AC
  Administered 2016-08-31: 50 ug via INTRAVENOUS

## 2016-08-31 SURGICAL SUPPLY — 28 items
BAG HAMPER (MISCELLANEOUS) ×2 IMPLANT
CLOTH BEACON ORANGE TIMEOUT ST (SAFETY) ×2 IMPLANT
COVER LIGHT HANDLE STERIS (MISCELLANEOUS) ×4 IMPLANT
DECANTER SPIKE VIAL GLASS SM (MISCELLANEOUS) ×2 IMPLANT
DRAPE PROXIMA HALF (DRAPES) ×2 IMPLANT
ELECT REM PT RETURN 9FT ADLT (ELECTROSURGICAL) ×2
ELECTRODE REM PT RTRN 9FT ADLT (ELECTROSURGICAL) ×1 IMPLANT
FORMALIN 10 PREFIL 120ML (MISCELLANEOUS) ×2 IMPLANT
GAUZE SPONGE 4X4 12PLY STRL (GAUZE/BANDAGES/DRESSINGS) ×2 IMPLANT
GLOVE BIOGEL PI IND STRL 7.0 (GLOVE) ×1 IMPLANT
GLOVE BIOGEL PI INDICATOR 7.0 (GLOVE) ×1
GLOVE SURG SS PI 7.5 STRL IVOR (GLOVE) ×4 IMPLANT
GOWN STRL REUS W/ TWL XL LVL3 (GOWN DISPOSABLE) ×1 IMPLANT
GOWN STRL REUS W/TWL LRG LVL3 (GOWN DISPOSABLE) ×2 IMPLANT
GOWN STRL REUS W/TWL XL LVL3 (GOWN DISPOSABLE) ×1
HEMOSTAT SURGICEL 4X8 (HEMOSTASIS) ×2 IMPLANT
KIT ROOM TURNOVER AP CYSTO (KITS) ×2 IMPLANT
LIGASURE IMPACT 36 18CM CVD LR (INSTRUMENTS) ×2 IMPLANT
MANIFOLD NEPTUNE II (INSTRUMENTS) ×2 IMPLANT
NEEDLE HYPO 22GX1.5 SAFETY (NEEDLE) ×2 IMPLANT
NS IRRIG 1000ML POUR BTL (IV SOLUTION) ×2 IMPLANT
PACK PERI GYN (CUSTOM PROCEDURE TRAY) ×2 IMPLANT
PAD ARMBOARD 7.5X6 YLW CONV (MISCELLANEOUS) ×2 IMPLANT
SET BASIN LINEN APH (SET/KITS/TRAYS/PACK) ×2 IMPLANT
SURGILUBE 3G PEEL PACK STRL (MISCELLANEOUS) ×2 IMPLANT
SUT SILK 0 FSL (SUTURE) ×2 IMPLANT
SUT VIC AB 2-0 CT2 27 (SUTURE) IMPLANT
SYR 20CC LL (SYRINGE) ×2 IMPLANT

## 2016-08-31 NOTE — Op Note (Signed)
Patient:  Alan Leblanc  DOB:  Jan 21, 1960  MRN:  [id number]   Preop Diagnosis:  Grade 4 internal and external hemorrhoid with thrombosis  Postop Diagnosis:  Same  Procedure:  Extensive hemorrhoidectomy  Surgeon:  Aviva Signs, M.D.   Anes:  Gen.  Indications:  Patient is a 56 year old Hispanic male who presents with thrombosed and necrotic internal and external hemorrhoids along the right lateral aspect of the anus. The risks and benefits of the procedure including bleeding, infection, and recurrence of the hemorrhoidal disease were fully explained to the patient, who gave informed consent.  Procedure note:  The patient was placed in the lithotomy position after general anesthesia was administered. The area was prepped and draped using usual sterile technique with Betadine. Surgical site confirmation was performed.  On rectal examination, the patient had very large and necrotic internal and external hemorrhoids at both the 10:00 and 7:00 positions. A smaller thrombosed internal and external hemorrhoid was noted at 6:00 position. All were removed in a columnlike fashion using the LigaSure. Care was taken to avoid the external sphincter mechanism. External was instilled into the surrounding peritoneum. The patient did have smaller internal hemorrhoids along the left aspect of the anus. These were not addressed. No abnormal bleeding was noted at the end of the procedure. Surgicel and viscous Xylocaine rectal packing was placed.  All tape and needle counts were correct at the end of the procedure. The patient was awakened and transferred to PACU in stable condition.  Complications:  None  EBL:  Minimal  Specimen:  Hemorrhoids

## 2016-08-31 NOTE — Anesthesia Preprocedure Evaluation (Addendum)
Anesthesia Evaluation  Patient identified by MRN, date of birth, ID band Patient awake    Airway Mallampati: III  TM Distance: >3 FB Neck ROM: Full    Dental  (+) Teeth Intact   Pulmonary sleep apnea ,    Pulmonary exam normal breath sounds clear to auscultation       Cardiovascular Normal cardiovascular exam Rhythm:Regular Rate:Normal     Neuro/Psych    GI/Hepatic hiatal hernia, GERD  Controlled,  Endo/Other    Renal/GU      Musculoskeletal   Abdominal (+) + obese,  Abdomen: soft.    Peds  Hematology   Anesthesia Other Findings   Reproductive/Obstetrics                             Anesthesia Physical Anesthesia Plan  ASA: III  Anesthesia Plan: General   Post-op Pain Management:    Induction: Intravenous  PONV Risk Score and Plan:   Airway Management Planned: Oral ETT and Video Laryngoscope Planned  Additional Equipment:   Intra-op Plan:   Post-operative Plan: Extubation in OR  Informed Consent: I have reviewed the patients History and Physical, chart, labs and discussed the procedure including the risks, benefits and alternatives for the proposed anesthesia with the patient or authorized representative who has indicated his/her understanding and acceptance.   Dental advisory given  Plan Discussed with: CRNA  Anesthesia Plan Comments:        Anesthesia Quick Evaluation

## 2016-08-31 NOTE — Interval H&P Note (Signed)
History and Physical Interval Note:  08/31/2016 12:47 PM  [place]  has presented today for surgery, with the diagnosis of thrombosed hemorrhoids  The various methods of treatment have been discussed with the patient and family. After consideration of risks, benefits and other options for treatment, the patient has consented to  Procedure(s): EXTENSIVE HEMORRHOIDECTOMY (N/A) as a surgical intervention .  The patient's history has been reviewed, patient examined, no change in status, stable for surgery.  I have reviewed the patient's chart and labs.  Questions were answered to the patient's satisfaction.     Aviva Signs

## 2016-08-31 NOTE — Anesthesia Postprocedure Evaluation (Signed)
Anesthesia Post Note  Patient: Alan Leblanc  Procedure(s) Performed: Procedure(s) (LRB): EXTENSIVE HEMORRHOIDECTOMY (N/A)  Patient location during evaluation: PACU Anesthesia Type: General Level of consciousness: oriented, awake and alert and patient cooperative Pain management: pain level controlled Respiratory status: spontaneous breathing Cardiovascular status: stable Postop Assessment: no signs of nausea or vomiting Anesthetic complications: no     Last Vitals:  Vitals:   08/31/16 1445 08/31/16 1504  BP: 137/85 133/90  Pulse: 70 (!) 54  Resp: 16 18  Temp:    SpO2: 93% 95%    Last Pain:  Vitals:   08/31/16 1434  TempSrc:   PainSc: 0-No pain                 [name] A

## 2016-08-31 NOTE — Transfer of Care (Signed)
Immediate Anesthesia Transfer of Care Note  Patient: [place]  Procedure(s) Performed: Procedure(s): EXTENSIVE HEMORRHOIDECTOMY (N/A)  Patient Location: PACU  Anesthesia Type:General  Level of Consciousness: awake and patient cooperative  Airway & Oxygen Therapy: Patient Spontanous Breathing and non-rebreather face mask  Post-op Assessment: Report given to RN and Post -op Vital signs reviewed and stable  Post vital signs: Reviewed and stable  Last Vitals:  Vitals:   08/31/16 1300 08/31/16 1305  BP: 119/65 114/66  Resp: (!) 24 18  Temp:    SpO2: 100% 100%    Last Pain:  Vitals:   08/31/16 1158  TempSrc: Oral  PainSc: 10-Worst pain ever      Patients Stated Pain Goal: 10 (33/83/29 1916)  Complications: No apparent anesthesia complications

## 2016-08-31 NOTE — Anesthesia Procedure Notes (Signed)
Procedure Name: Intubation Date/Time: 08/31/2016 1:56 PM Performed by: Vista Deck Pre-anesthesia Checklist: Patient identified, Emergency Drugs available, Suction available, Patient being monitored and Timeout performed Patient Re-evaluated:Patient Re-evaluated prior to induction Oxygen Delivery Method: Circle system utilized Preoxygenation: Pre-oxygenation with 100% oxygen Induction Type: IV induction Ventilation: Two handed mask ventilation required Laryngoscope Size: Glidescope and 3 (LoPro 3) Grade View: Grade II Tube type: Oral Tube size: 7.0 mm Number of attempts: 1 Airway Equipment and Method: Video-laryngoscopy and Stylet Placement Confirmation: ETT inserted through vocal cords under direct vision,  positive ETCO2 and breath sounds checked- equal and bilateral Secured at: 22 cm Tube secured with: Tape Dental Injury: Teeth and Oropharynx as per pre-operative assessment

## 2016-08-31 NOTE — Discharge Instructions (Signed)
Surgical Procedures for Hemorrhoids, Care After °Refer to this sheet in the next few weeks. These instructions provide you with information about caring for yourself after your procedure. Your health care provider may also give you more specific instructions. Your treatment has been planned according to current medical practices, but problems sometimes occur. Call your health care provider if you have any problems or questions after your procedure. °What can I expect after the procedure? °After the procedure, it is common to have: °· Rectal pain. °· Pain when you are having a bowel movement. °· Slight rectal bleeding. ° °Follow these instructions at home: °Medicines °· Take over-the-counter and prescription medicines only as told by your health care provider. °· Do not drive or operate heavy machinery while taking prescription pain medicine. °· Use a stool softener or a bulk laxative as told by your health care provider. °Activity °· Rest at home. Return to your normal activities as told by your health care provider. °· Do not lift anything that is heavier than 10 lb (4.5 kg). °· Do not sit for long periods of time. Take a walk every day or as told by your health care provider. °· Do not strain to have a bowel movement. Do not spend a long time sitting on the toilet. °Eating and drinking °· Eat foods that contain fiber, such as whole grains, beans, nuts, fruits, and vegetables. °· Drink enough fluid to keep your urine clear or pale yellow. °General instructions °· Sit in a warm bath 2-3 times per day to relieve soreness or itching. °· Keep all follow-up visits as told by your health care provider. This is important. °Contact a health care provider if: °· Your pain medicine is not helping. °· You have a fever or chills. °· You become constipated. °· You have trouble passing urine. °Get help right away if: °· You have very bad rectal pain. °· You have heavy bleeding from your rectum. °This information is not intended  to replace advice given to you by your health care provider. Make sure you discuss any questions you have with your health care provider. °Document Released: 03/19/2003 Document Revised: 06/04/2015 Document Reviewed: 03/24/2014 °Elsevier Interactive Patient Education © 2018 Elsevier Inc. ° °

## 2016-09-01 ENCOUNTER — Encounter: Payer: Self-pay | Admitting: General Surgery

## 2016-09-01 ENCOUNTER — Ambulatory Visit (INDEPENDENT_AMBULATORY_CARE_PROVIDER_SITE_OTHER): Payer: Self-pay | Admitting: General Surgery

## 2016-09-01 VITALS — BP 135/86 | HR 57 | Temp 97.4°F | Resp 20 | Ht 67.0 in | Wt 217.0 lb

## 2016-09-01 DIAGNOSIS — Z09 Encounter for follow-up examination after completed treatment for conditions other than malignant neoplasm: Secondary | ICD-10-CM

## 2016-09-01 NOTE — Progress Notes (Signed)
Subjective:     Alan Leblanc  Status post extensive hemorrhoidectomy yesterday. Patient presents with significant rectal pain with extension to the coccyx. He has voided. He has had no bowel movement. He denies any blood per rectum. Objective:    BP 135/86   Pulse (!) 57   Temp (!) 97.4 F (36.3 C)   Resp 20   Ht 5\' 7"  (1.702 m)   Wt 217 lb (98.4 kg)   BMI 33.99 kg/m   General:  alert and cooperative  Not done     Assessment:    Rectal pain after extensive hemorrhoidectomy, not unexpected. Is having no active bleeding.    Plan:   I told patient that this was not unexpected. He should continue stool softeners and drink plenty of liquids. I reassured him that he was doing as expected. We'll follow up in one week as previously scheduled.

## 2016-09-08 ENCOUNTER — Encounter: Payer: Self-pay | Admitting: General Surgery

## 2016-09-08 ENCOUNTER — Ambulatory Visit (INDEPENDENT_AMBULATORY_CARE_PROVIDER_SITE_OTHER): Payer: Self-pay | Admitting: General Surgery

## 2016-09-08 VITALS — BP 124/69 | HR 55 | Temp 97.3°F | Resp 18 | Ht 67.0 in | Wt 212.0 lb

## 2016-09-08 DIAGNOSIS — Z09 Encounter for follow-up examination after completed treatment for conditions other than malignant neoplasm: Secondary | ICD-10-CM

## 2016-09-08 MED ORDER — OXYCODONE-ACETAMINOPHEN 7.5-325 MG PO TABS: 1.0000 | ORAL_TABLET | ORAL | 0 refills | Status: AC | PRN

## 2016-09-08 NOTE — Progress Notes (Signed)
Subjective:     Alan Leblanc    Status post extensive hemorrhoidectomy.  He is doing better than when I last saw him.  He still has some rectal pain. Objective:    BP 124/69   Pulse (!) 55   Temp (!) 97.3 F (36.3 C)   Resp 18   Ht 5\' 7"  (1.702 m)   Wt 212 lb (96.2 kg)   BMI 33.20 kg/m   General:  alert, cooperative and no distress    Rectal examination reveals well healing rectum.  No purulent drainage noted.  No active bleeding noted.     Assessment:    Recovering slowly but as expected    Plan:    Percocet for pain and has been prescribed.  Rectiv  Samples have been given to control his rectal pain.  He should continue staying out of work for right now.  Will see him again in 2 weeks for follow-up.

## 2016-09-16 DIAGNOSIS — E291 Testicular hypofunction: Secondary | ICD-10-CM | POA: Diagnosis not present

## 2016-09-16 DIAGNOSIS — Z0000 Encounter for general adult medical examination without abnormal findings: Secondary | ICD-10-CM | POA: Diagnosis not present

## 2016-09-16 DIAGNOSIS — D649 Anemia, unspecified: Secondary | ICD-10-CM | POA: Diagnosis not present

## 2016-09-20 DIAGNOSIS — K2970 Gastritis, unspecified, without bleeding: Secondary | ICD-10-CM | POA: Diagnosis not present

## 2016-09-20 DIAGNOSIS — M25561 Pain in right knee: Secondary | ICD-10-CM | POA: Diagnosis not present

## 2016-09-20 DIAGNOSIS — K649 Unspecified hemorrhoids: Secondary | ICD-10-CM | POA: Diagnosis not present

## 2016-09-20 DIAGNOSIS — D649 Anemia, unspecified: Secondary | ICD-10-CM | POA: Diagnosis not present

## 2016-09-22 ENCOUNTER — Encounter: Payer: Self-pay | Admitting: General Surgery

## 2016-09-22 ENCOUNTER — Ambulatory Visit (INDEPENDENT_AMBULATORY_CARE_PROVIDER_SITE_OTHER): Payer: Self-pay | Admitting: General Surgery

## 2016-09-22 VITALS — BP 121/80 | HR 61 | Temp 97.7°F | Resp 18 | Ht 66.0 in | Wt 213.0 lb

## 2016-09-22 DIAGNOSIS — Z09 Encounter for follow-up examination after completed treatment for conditions other than malignant neoplasm: Secondary | ICD-10-CM

## 2016-09-22 NOTE — Progress Notes (Signed)
Subjective:     Alan Leblanc  Status post extensive hemorrhoidectomy. He states he feels significantly better. No blood in his stools. No incontinence. Pain has significantly decreased. Objective:    BP 121/80   Pulse 61   Temp 97.7 F (36.5 C)   Resp 18   Ht 5\' 6"  (1.676 m)   Wt 213 lb (96.6 kg)   BMI 34.38 kg/m   General:  alert, cooperative and no distress  Rectal examination reveals well-healed surgical scars. No swelling noted.     Assessment:    Doing well postoperatively.    Plan:   Follow-up as needed.

## 2017-01-09 ENCOUNTER — Other Ambulatory Visit (HOSPITAL_BASED_OUTPATIENT_CLINIC_OR_DEPARTMENT_OTHER): Payer: Self-pay

## 2017-01-09 DIAGNOSIS — R0683 Snoring: Secondary | ICD-10-CM

## 2017-01-22 ENCOUNTER — Ambulatory Visit: Payer: BLUE CROSS/BLUE SHIELD | Attending: Internal Medicine | Admitting: Neurology

## 2017-01-22 DIAGNOSIS — G4733 Obstructive sleep apnea (adult) (pediatric): Secondary | ICD-10-CM | POA: Diagnosis not present

## 2017-01-22 DIAGNOSIS — R0683 Snoring: Secondary | ICD-10-CM | POA: Diagnosis present

## 2017-01-22 DIAGNOSIS — Z79899 Other long term (current) drug therapy: Secondary | ICD-10-CM | POA: Insufficient documentation

## 2017-01-23 NOTE — Procedures (Signed)
   Riley A. Merlene Laughter, MD     [email or web address]             NOCTURNAL POLYSOMNOGRAPHY   LOCATION: [facility]  Patient Name: Alan Leblanc, Alan Leblanc Date: 01/22/2017 Gender: Male D.O.B: 15-Mar-1960 Age (years): 56 Referring Provider: Delphina Cahill Height (inches): 67 Interpreting Physician: Phillips Odor MD, ABSM Weight (lbs): 213 RPSGT: Rosebud Poles BMI: 33 MRN: [id number] Neck Size: 17.50 CLINICAL INFORMATION Sleep Study Type: NPSG     Indication for sleep study: Snoring     Epworth Sleepiness Score: 0     SLEEP STUDY TECHNIQUE As per the AASM Manual for the Scoring of Sleep and Associated Events v2.3 (April 2016) with a hypopnea requiring 4% desaturations.  The channels recorded and monitored were frontal, central and occipital EEG, electrooculogram (EOG), submentalis EMG (chin), nasal and oral airflow, thoracic and abdominal wall motion, anterior tibialis EMG, snore microphone, electrocardiogram, and pulse oximetry.  MEDICATIONS Medications self-administered by patient taken the night of the study : N/A  Current Outpatient Medications:  .  CARAFATE 1 GM/10ML suspension, Take 4 Doses by mouth daily., Disp: , Rfl: 0 .  docusate sodium (COLACE) 100 MG capsule, Take 1 capsule (100 mg total) by mouth every 12 (twelve) hours., Disp: 60 capsule, Rfl: 0 .  oxyCODONE-acetaminophen (PERCOCET) 7.5-325 MG tablet, Take 1-2 tablets by mouth every 4 (four) hours as needed for severe pain., Disp: 60 tablet, Rfl: 0 .  pantoprazole (PROTONIX) 40 MG tablet, Take 1 tablet (40 mg total) by mouth daily as needed (for acid reflux)., Disp: 30 tablet, Rfl: 11     SLEEP ARCHITECTURE The study was initiated at 10:31:31 PM and ended at 4:52:38 AM.  Sleep onset time was 6.7 minutes and the sleep efficiency was 91.8%. The total sleep time was 349.9 minutes.  Stage REM latency was 167.5 minutes.  The patient spent 4.43% of the night in stage N1 sleep, 79.42% in  stage N2 sleep, 3.72% in stage N3 and 12.43% in REM.  Alpha intrusion was absent.  Supine sleep was 9.90%.  RESPIRATORY PARAMETERS The overall apnea/hypopnea index (AHI) was 54.9 per hour. There were 31 total apneas, including 31 obstructive, 0 central and 0 mixed apneas. There were 289 hypopneas and 0 RERAs.  The AHI during Stage REM sleep was 64.8 per hour.  AHI while supine was 46.8 per hour.  The mean oxygen saturation was 90.82%. The minimum SpO2 during sleep was 71.00%.  loud snoring was noted during this study.  CARDIAC DATA The 2 lead EKG demonstrated sinus rhythm. The mean heart rate was N/A beats per minute. Other EKG findings include: None. LEG MOVEMENT DATA The total PLMS were 0 with a resulting PLMS index of 0.00. Associated arousal with leg movement index was 0.0.  IMPRESSIONS Severe obstructive sleep apnea is documented during this recording. AutoPAP 8-15 is recommended.    Delano Metz, MD Diplomate, American Board of Sleep Medicine. ELECTRONICALLY SIGNED ON:  01/23/2017, 9:53 PM [place] PH: [phone]   FX: [phone] [place]

## 2017-02-07 ENCOUNTER — Encounter: Payer: Self-pay | Admitting: Internal Medicine

## 2017-04-11 NOTE — Progress Notes (Deleted)
Referring Provider: Celene Squibb, MD Primary Care Physician:  Celene Squibb, MD Primary GI:  Dr. Gala Romney  No chief complaint on file.   HPI:   Alan Leblanc is a 57 y.o. male who presents based on recall letter indicating follow-up office visit.  The patient was last seen in our office 03/21/2016 for GERD and constipation.  Noted history of GERD, constipation.  Ventral hernia repair with mesh placement 12/07/2015.  At his last visit he was doing well with occasional/rare breakthrough, not taking Protonix anymore, able to drink coffee without issue.  Some intermittent constipation and at times will go to 3 days without a bowel movement.  No further hematochezia.  Some abdominal "pulling sensation" around surgical site.  No other GI symptoms.  Recommended Protonix as needed, MiraLAX 17 g 1-2 times a day, follow-up in 1 year.  Today he states   Past Medical History:  Diagnosis Date  . Anemia   . Broken neck ([place]) 2003  . Cholecystitis   . GERD (gastroesophageal reflux disease)   . Rib fractures 2003  . Skull fracture ([place]) 2003  . Sleep apnea     Past Surgical History:  Procedure Laterality Date  . APPENDECTOMY    . CHOLECYSTECTOMY    . COLONOSCOPY    . COLONOSCOPY N/A 05/07/2015   Procedure: COLONOSCOPY;  Surgeon: Daneil Dolin, MD;  [facility];  Service: Endoscopy;  Laterality: N/A;  0900  . ESOPHAGOGASTRODUODENOSCOPY N/A 05/07/2015   Procedure: ESOPHAGOGASTRODUODENOSCOPY (EGD);  Surgeon: Daneil Dolin, MD;  Location: [facility];  Service: Endoscopy;  Laterality: N/A;  . HEMORRHOID SURGERY N/A 08/31/2016   Procedure: EXTENSIVE HEMORRHOIDECTOMY;  Surgeon: Aviva Signs, MD;  Location: [facility];  Service: General;  Laterality: N/A;  . LEG SURGERY Right     right ankle fx  . VENTRAL HERNIA REPAIR N/A 12/07/2015   Procedure: HERNIA REPAIR VENTRAL ADULT WITH MESH;  Surgeon: Aviva Signs, MD;  Location: [facility];  Service: General;  Laterality: N/A;    Current  Outpatient Medications  Medication Sig Dispense Refill  . CARAFATE 1 GM/10ML suspension Take 4 Doses by mouth daily.  0  . docusate sodium (COLACE) 100 MG capsule Take 1 capsule (100 mg total) by mouth every 12 (twelve) hours. 60 capsule 0  . oxyCODONE-acetaminophen (PERCOCET) 7.5-325 MG tablet Take 1-2 tablets by mouth every 4 (four) hours as needed for severe pain. 60 tablet 0  . pantoprazole (PROTONIX) 40 MG tablet Take 1 tablet (40 mg total) by mouth daily as needed (for acid reflux). 30 tablet 11   No current facility-administered medications for this visit.     Allergies as of 04/12/2017 - Review Complete 09/22/2016  Allergen Reaction Noted  . Penicillins Other (See Comments)     Family History  Problem Relation Age of Onset  . Osteoarthritis Mother   . Tuberculosis Father   . Colon cancer Neg Hx        Limited knowledge of family history    Social History   Socioeconomic History  . Marital status: Married    Spouse name: Not on file  . Number of children: Not on file  . Years of education: Not on file  . Highest education level: Not on file  Occupational History  . Not on file  Social Needs  . Financial resource strain: Not on file  . Food insecurity:    Worry: Not on file    Inability: Not on file  .  Transportation needs:    Medical: Not on file    Non-medical: Not on file  Tobacco Use  . Smoking status: Never Smoker  . Smokeless tobacco: Never Used  Substance and Sexual Activity  . Alcohol use: No    Alcohol/week: 0.0 oz  . Drug use: No  . Sexual activity: Yes    Birth control/protection: None  Lifestyle  . Physical activity:    Days per week: Not on file    Minutes per session: Not on file  . Stress: Not on file  Relationships  . Social connections:    Talks on phone: Not on file    Gets together: Not on file    Attends religious service: Not on file    Active member of club or organization: Not on file    Attends meetings of clubs or  organizations: Not on file    Relationship status: Not on file  Other Topics Concern  . Not on file  Social History Narrative  . Not on file    Review of Systems: General: Negative for anorexia, weight loss, fever, chills, fatigue, weakness. Eyes: Negative for vision changes.  ENT: Negative for hoarseness, difficulty swallowing , nasal congestion. CV: Negative for chest pain, angina, palpitations, dyspnea on exertion, peripheral edema.  Respiratory: Negative for dyspnea at rest, dyspnea on exertion, cough, sputum, wheezing.  GI: See history of present illness. GU:  Negative for dysuria, hematuria, urinary incontinence, urinary frequency, nocturnal urination.  MS: Negative for joint pain, low back pain.  Derm: Negative for rash or itching.  Neuro: Negative for weakness, abnormal sensation, seizure, frequent headaches, memory loss, confusion.  Psych: Negative for anxiety, depression, suicidal ideation, hallucinations.  Endo: Negative for unusual weight change.  Heme: Negative for bruising or bleeding. Allergy: Negative for rash or hives.   Physical Exam: There were no vitals taken for this visit. General:   Alert and oriented. Pleasant and cooperative. Well-nourished and well-developed.  Head:  Normocephalic and atraumatic. Eyes:  Without icterus, sclera clear and conjunctiva pink.  Ears:  Normal auditory acuity. Mouth:  No deformity or lesions, oral mucosa pink.  Throat/Neck:  Supple, without mass or thyromegaly. Cardiovascular:  S1, S2 present without murmurs appreciated. Normal pulses noted. Extremities without clubbing or edema. Respiratory:  Clear to auscultation bilaterally. No wheezes, rales, or rhonchi. No distress.  Gastrointestinal:  +BS, soft, non-tender and non-distended. No HSM noted. No guarding or rebound. No masses appreciated.  Rectal:  Deferred  Musculoskalatal:  Symmetrical without gross deformities. Normal posture. Skin:  Intact without significant lesions or  rashes. Neurologic:  Alert and oriented x4;  grossly normal neurologically. Psych:  Alert and cooperative. Normal mood and affect. Heme/Lymph/Immune: No significant cervical adenopathy. No excessive bruising noted.    04/11/2017 10:46 AM   Disclaimer: This note was dictated with voice recognition software. Similar sounding words can inadvertently be transcribed and may not be corrected upon review.

## 2017-04-12 ENCOUNTER — Ambulatory Visit: Payer: BLUE CROSS/BLUE SHIELD | Admitting: Nurse Practitioner

## 2017-04-25 DIAGNOSIS — G4733 Obstructive sleep apnea (adult) (pediatric): Secondary | ICD-10-CM | POA: Diagnosis not present

## 2017-04-25 DIAGNOSIS — K2970 Gastritis, unspecified, without bleeding: Secondary | ICD-10-CM | POA: Diagnosis not present

## 2017-04-25 DIAGNOSIS — F330 Major depressive disorder, recurrent, mild: Secondary | ICD-10-CM | POA: Diagnosis not present

## 2017-04-25 DIAGNOSIS — D649 Anemia, unspecified: Secondary | ICD-10-CM | POA: Diagnosis not present

## 2017-04-25 DIAGNOSIS — E291 Testicular hypofunction: Secondary | ICD-10-CM | POA: Diagnosis not present

## 2017-05-09 DIAGNOSIS — D509 Iron deficiency anemia, unspecified: Secondary | ICD-10-CM | POA: Diagnosis not present

## 2017-05-09 DIAGNOSIS — E291 Testicular hypofunction: Secondary | ICD-10-CM | POA: Diagnosis not present

## 2017-05-09 DIAGNOSIS — G4733 Obstructive sleep apnea (adult) (pediatric): Secondary | ICD-10-CM | POA: Diagnosis not present

## 2017-05-09 DIAGNOSIS — Z0000 Encounter for general adult medical examination without abnormal findings: Secondary | ICD-10-CM | POA: Diagnosis not present

## 2017-06-12 ENCOUNTER — Ambulatory Visit (INDEPENDENT_AMBULATORY_CARE_PROVIDER_SITE_OTHER): Payer: BLUE CROSS/BLUE SHIELD | Admitting: Nurse Practitioner

## 2017-06-12 ENCOUNTER — Encounter: Payer: Self-pay | Admitting: Nurse Practitioner

## 2017-06-12 VITALS — BP 130/76 | HR 58 | Temp 97.1°F | Ht 67.0 in | Wt 195.8 lb

## 2017-06-12 DIAGNOSIS — K2100 Gastro-esophageal reflux disease with esophagitis, without bleeding: Secondary | ICD-10-CM

## 2017-06-12 DIAGNOSIS — K5900 Constipation, unspecified: Secondary | ICD-10-CM

## 2017-06-12 MED ORDER — PANTOPRAZOLE SODIUM 40 MG PO TBEC: 40.0000 mg | DELAYED_RELEASE_TABLET | Freq: Every day | ORAL | 11 refills | Status: DC | PRN

## 2017-06-12 NOTE — Patient Instructions (Signed)
1. Continue taking your current medications.  I have refilled your acid blocker Protonix 2. Start taking Colace 100 mg once a day.  This is an over-the-counter stool softener. 3. If you need something extra for your constipation occasionally, you can use MiraLAX 1-2 times a day if needed. 4. Call us in 1 to 2 months and let us know if this is helping her constipation.  If not, we can send in a prescription medicine and likely provide samples for you to try. 5. Follow-up in 6 months. 6. Call us if you have any questions or concerns.  At [facility] we value your feedback. You may receive a survey about your visit today. Please share your experience as we strive to create trusting relationships with our patients to provide genuine, compassionate, quality care.  It was great to see you today!  Congratulations on your weight loss and I hope you have a great summer!!

## 2017-06-12 NOTE — Progress Notes (Signed)
Referring Provider: Celene Squibb, MD Primary Care Physician:  Celene Squibb, MD Primary GI:  Dr. Gala Romney  Chief Complaint  Patient presents with  . Constipation    HPI:   Alan Leblanc is a 57 y.o. male who presents for follow-up due to letter received at his home indicating it was time.  He was last seen in our office 03/21/2016 for GERD, constipation.  History of GERD.  Recent ventral hernia repair 12/07/2015.  At his last visit he was doing well overall, occasional/rare breakthrough which resolves with time.  Not on Protonix anymore, able to drink coffee without issue.  Intermittent constipation and sometimes will go 2 to 3 times a day and others will go 2 to 3 days without a bowel movement.  His incisional hernia area feels "abnormal" likely due to scar tissue.  Recommended he follow-up with surgeon if he continues to have symptoms.  He noted he was anxious to return to [facility] after his visit because his daughter was in active labor.  Recommended MiraLAX 1-2 times a day if no bowel movement in 2 days, restart Protonix.  Otherwise, continue with spot management as necessary for rare symptoms.  Follow-up in 1 year.  On 08/31/2016 he did have surgery for extensive hemorrhoidectomy.  Other than some postoperative rectal pain which resolved by the month after, his postop course was uncomplicated.  Today he states he's doing ok overall. He has lost 30 lbs intentionally which has helped his GERD. He has been having weakness, is eating mostly just vegetables, salads. Eats meat (mostly chicken) at least once a day. Intermittently skips breakfast. Discussed need to include protein with every meal for energy. His knee pain is improved with weight loss. Is walking about 2 miles daily. Has occasional constipation, has a bowel movement daily but stools are hard and difficult to pass. Has occasional/intermittent abdominal pain, but not as bad as before. Denies hematochezia, melena, fever, chills,  unintentional weight loss.   Saw PCP (Dr. Nevada Crane) a month ago and his iron levels were improved. Is asking if that can be rechecked. Has PCP visit in August.  Past Medical History:  Diagnosis Date  . Anemia   . Broken neck ([place]) 2003  . Cholecystitis   . GERD (gastroesophageal reflux disease)   . Rib fractures 2003  . Skull fracture ([place]) 2003  . Sleep apnea     Past Surgical History:  Procedure Laterality Date  . APPENDECTOMY    . CHOLECYSTECTOMY    . COLONOSCOPY    . COLONOSCOPY N/A 05/07/2015   Procedure: COLONOSCOPY;  Surgeon: Daneil Dolin, MD;  [facility];  Service: Endoscopy;  Laterality: N/A;  0900  . ESOPHAGOGASTRODUODENOSCOPY N/A 05/07/2015   Procedure: ESOPHAGOGASTRODUODENOSCOPY (EGD);  Surgeon: Daneil Dolin, MD;  [facility];  Service: Endoscopy;  Laterality: N/A;  . HEMORRHOID SURGERY N/A 08/31/2016   Procedure: EXTENSIVE HEMORRHOIDECTOMY;  Surgeon: Aviva Signs, MD;  Location: [facility];  Service: General;  Laterality: N/A;  . LEG SURGERY Right     right ankle fx  . VENTRAL HERNIA REPAIR N/A 12/07/2015   Procedure: HERNIA REPAIR VENTRAL ADULT WITH MESH;  Surgeon: Aviva Signs, MD;  Location: [facility];  Service: General;  Laterality: N/A;    Current Outpatient Medications  Medication Sig Dispense Refill  . CARAFATE 1 GM/10ML suspension Take 4 Doses by mouth as needed.   0  . docusate sodium (COLACE) 100 MG capsule Take 1 capsule (100 mg  total) by mouth every 12 (twelve) hours. (Patient taking differently: Take 100 mg by mouth daily as needed. ) 60 capsule 0  . pantoprazole (PROTONIX) 40 MG tablet Take 1 tablet (40 mg total) by mouth daily as needed (for acid reflux). 30 tablet 11  . oxyCODONE-acetaminophen (PERCOCET) 7.5-325 MG tablet Take 1-2 tablets by mouth every 4 (four) hours as needed for severe pain. (Patient not taking: Reported on 06/12/2017) 60 tablet 0   No current facility-administered medications for this visit.     Allergies as of  06/12/2017 - Review Complete 06/12/2017  Allergen Reaction Noted  . Penicillins Other (See Comments)     Family History  Problem Relation Age of Onset  . Osteoarthritis Mother   . Tuberculosis Father   . Colon cancer Neg Hx        Limited knowledge of family history    Social History   Socioeconomic History  . Marital status: Married    Spouse name: Not on file  . Number of children: Not on file  . Years of education: Not on file  . Highest education level: Not on file  Occupational History  . Not on file  Social Needs  . Financial resource strain: Not on file  . Food insecurity:    Worry: Not on file    Inability: Not on file  . Transportation needs:    Medical: Not on file    Non-medical: Not on file  Tobacco Use  . Smoking status: Never Smoker  . Smokeless tobacco: Never Used  Substance and Sexual Activity  . Alcohol use: No    Alcohol/week: 0.0 oz  . Drug use: No  . Sexual activity: Yes    Birth control/protection: None  Lifestyle  . Physical activity:    Days per week: Not on file    Minutes per session: Not on file  . Stress: Not on file  Relationships  . Social connections:    Talks on phone: Not on file    Gets together: Not on file    Attends religious service: Not on file    Active member of club or organization: Not on file    Attends meetings of clubs or organizations: Not on file    Relationship status: Not on file  Other Topics Concern  . Not on file  Social History Narrative  . Not on file    Review of Systems: General: Negative for anorexia, weight loss, fever, chills, fatigue, weakness. ENT: Negative for hoarseness, difficulty swallowing , nasal congestion. CV: Negative for chest pain, angina, palpitations, dyspnea on exertion, peripheral edema.  Respiratory: Negative for dyspnea at rest, dyspnea on exertion, cough, sputum, wheezing.  GI: See history of present illness. Endo: Negative for unusual weight change.  Heme: Negative for  bruising or bleeding.   Physical Exam: BP 130/76   Pulse (!) 58   Temp (!) 97.1 F (36.2 C) (Oral)   Ht 5\' 7"  (1.702 m)   Wt 195 lb 12.8 oz (88.8 kg)   BMI 30.67 kg/m  General:   Alert and oriented. Pleasant and cooperative. Well-nourished and well-developed.  Eyes:  Without icterus, sclera clear and conjunctiva pink.  Ears:  Normal auditory acuity. Cardiovascular:  S1, S2 present without murmurs appreciated. Extremities without clubbing or edema. Respiratory:  Clear to auscultation bilaterally. No wheezes, rales, or rhonchi. No distress.  Gastrointestinal:  +BS, soft, non-tender and non-distended. No HSM noted. No guarding or rebound. No masses appreciated.  Rectal:  Deferred  Musculoskalatal:  Symmetrical without gross deformities. Neurologic:  Alert and oriented x4;  grossly normal neurologically. Psych:  Alert and cooperative. Normal mood and affect. Heme/Lymph/Immune: No excessive bruising noted.    06/12/2017 11:13 AM   Disclaimer: This note was dictated with voice recognition software. Similar sounding words can inadvertently be transcribed and may not be corrected upon review.

## 2017-06-12 NOTE — Assessment & Plan Note (Signed)
Has difficult to pass stools although he does typically go every day.  Recently had hemorrhoidectomy likely caused by years of constipation.  At this point I will start him on 100 mg of Colace daily.  He can add MiraLAX 1-2 times a day, if needed.  Recommended progress report in 1 to 2 months to decide if a prescription constipation medicine as needed.  Follow-up in 6 months.

## 2017-06-12 NOTE — Assessment & Plan Note (Signed)
Symptoms significantly improved on PPI and with approximate 20 to 30 pound weight loss, intentional.  He is requesting a refill of his PPI, which I will send to the pharmacy.  Recommend he continue his fantastic lifestyle changes.  Follow-up in 6 months.  Call if any other concerns.

## 2017-06-13 NOTE — Progress Notes (Signed)
CC'D TO PCP °

## 2017-10-20 ENCOUNTER — Encounter: Payer: Self-pay | Admitting: Internal Medicine

## 2017-10-25 DIAGNOSIS — K2970 Gastritis, unspecified, without bleeding: Secondary | ICD-10-CM | POA: Diagnosis not present

## 2017-10-25 DIAGNOSIS — N529 Male erectile dysfunction, unspecified: Secondary | ICD-10-CM | POA: Diagnosis not present

## 2017-10-25 DIAGNOSIS — D649 Anemia, unspecified: Secondary | ICD-10-CM | POA: Diagnosis not present

## 2017-10-25 DIAGNOSIS — E291 Testicular hypofunction: Secondary | ICD-10-CM | POA: Diagnosis not present

## 2017-10-25 DIAGNOSIS — G4733 Obstructive sleep apnea (adult) (pediatric): Secondary | ICD-10-CM | POA: Diagnosis not present

## 2017-10-25 DIAGNOSIS — F330 Major depressive disorder, recurrent, mild: Secondary | ICD-10-CM | POA: Diagnosis not present

## 2017-10-31 DIAGNOSIS — G4733 Obstructive sleep apnea (adult) (pediatric): Secondary | ICD-10-CM | POA: Diagnosis not present

## 2017-10-31 DIAGNOSIS — N529 Male erectile dysfunction, unspecified: Secondary | ICD-10-CM | POA: Diagnosis not present

## 2017-10-31 DIAGNOSIS — D509 Iron deficiency anemia, unspecified: Secondary | ICD-10-CM | POA: Diagnosis not present

## 2017-10-31 DIAGNOSIS — E291 Testicular hypofunction: Secondary | ICD-10-CM | POA: Diagnosis not present

## 2017-11-03 DIAGNOSIS — G4733 Obstructive sleep apnea (adult) (pediatric): Secondary | ICD-10-CM | POA: Diagnosis not present

## 2017-11-28 DIAGNOSIS — H02834 Dermatochalasis of left upper eyelid: Secondary | ICD-10-CM | POA: Diagnosis not present

## 2017-11-28 DIAGNOSIS — H02831 Dermatochalasis of right upper eyelid: Secondary | ICD-10-CM | POA: Diagnosis not present

## 2017-11-28 DIAGNOSIS — H2513 Age-related nuclear cataract, bilateral: Secondary | ICD-10-CM | POA: Diagnosis not present

## 2017-11-28 DIAGNOSIS — H40013 Open angle with borderline findings, low risk, bilateral: Secondary | ICD-10-CM | POA: Diagnosis not present

## 2017-12-04 DIAGNOSIS — G4733 Obstructive sleep apnea (adult) (pediatric): Secondary | ICD-10-CM | POA: Diagnosis not present

## 2018-01-03 DIAGNOSIS — G4733 Obstructive sleep apnea (adult) (pediatric): Secondary | ICD-10-CM | POA: Diagnosis not present

## 2018-01-24 DIAGNOSIS — B308 Other viral conjunctivitis: Secondary | ICD-10-CM | POA: Diagnosis not present

## 2018-02-03 DIAGNOSIS — G4733 Obstructive sleep apnea (adult) (pediatric): Secondary | ICD-10-CM | POA: Diagnosis not present

## 2018-03-06 DIAGNOSIS — G4733 Obstructive sleep apnea (adult) (pediatric): Secondary | ICD-10-CM | POA: Diagnosis not present

## 2018-04-04 DIAGNOSIS — G4733 Obstructive sleep apnea (adult) (pediatric): Secondary | ICD-10-CM | POA: Diagnosis not present

## 2018-05-05 DIAGNOSIS — G4733 Obstructive sleep apnea (adult) (pediatric): Secondary | ICD-10-CM | POA: Diagnosis not present

## 2018-05-07 DIAGNOSIS — S93601A Unspecified sprain of right foot, initial encounter: Secondary | ICD-10-CM | POA: Diagnosis not present

## 2018-05-07 DIAGNOSIS — M79671 Pain in right foot: Secondary | ICD-10-CM | POA: Diagnosis not present

## 2018-06-04 DIAGNOSIS — G4733 Obstructive sleep apnea (adult) (pediatric): Secondary | ICD-10-CM | POA: Diagnosis not present

## 2018-06-05 DIAGNOSIS — G4733 Obstructive sleep apnea (adult) (pediatric): Secondary | ICD-10-CM | POA: Diagnosis not present

## 2018-06-05 DIAGNOSIS — E6609 Other obesity due to excess calories: Secondary | ICD-10-CM | POA: Diagnosis not present

## 2018-06-05 DIAGNOSIS — D649 Anemia, unspecified: Secondary | ICD-10-CM | POA: Diagnosis not present

## 2018-06-05 DIAGNOSIS — F330 Major depressive disorder, recurrent, mild: Secondary | ICD-10-CM | POA: Diagnosis not present

## 2018-06-12 DIAGNOSIS — F330 Major depressive disorder, recurrent, mild: Secondary | ICD-10-CM | POA: Diagnosis not present

## 2018-06-12 DIAGNOSIS — D649 Anemia, unspecified: Secondary | ICD-10-CM | POA: Diagnosis not present

## 2018-06-12 DIAGNOSIS — Z0001 Encounter for general adult medical examination with abnormal findings: Secondary | ICD-10-CM | POA: Diagnosis not present

## 2018-06-12 DIAGNOSIS — N529 Male erectile dysfunction, unspecified: Secondary | ICD-10-CM | POA: Diagnosis not present

## 2018-06-12 DIAGNOSIS — G4733 Obstructive sleep apnea (adult) (pediatric): Secondary | ICD-10-CM | POA: Diagnosis not present

## 2018-06-12 DIAGNOSIS — E6609 Other obesity due to excess calories: Secondary | ICD-10-CM | POA: Diagnosis not present

## 2018-06-12 DIAGNOSIS — Z6829 Body mass index (BMI) 29.0-29.9, adult: Secondary | ICD-10-CM | POA: Diagnosis not present

## 2018-06-12 DIAGNOSIS — D509 Iron deficiency anemia, unspecified: Secondary | ICD-10-CM | POA: Diagnosis not present

## 2018-06-12 DIAGNOSIS — E291 Testicular hypofunction: Secondary | ICD-10-CM | POA: Diagnosis not present

## 2018-06-13 ENCOUNTER — Other Ambulatory Visit: Payer: Self-pay | Admitting: Internal Medicine

## 2018-06-13 ENCOUNTER — Other Ambulatory Visit (HOSPITAL_COMMUNITY): Payer: Self-pay | Admitting: Internal Medicine

## 2018-06-13 DIAGNOSIS — E049 Nontoxic goiter, unspecified: Secondary | ICD-10-CM

## 2018-06-15 ENCOUNTER — Other Ambulatory Visit: Payer: Self-pay

## 2018-06-15 ENCOUNTER — Ambulatory Visit (HOSPITAL_COMMUNITY)
Admission: RE | Admit: 2018-06-15 | Discharge: 2018-06-15 | Disposition: A | Discharge status: Home / Self Care | Payer: BC Managed Care – PPO | Source: Ambulatory Visit | Attending: Internal Medicine | Admitting: Internal Medicine

## 2018-06-15 DIAGNOSIS — E049 Nontoxic goiter, unspecified: Secondary | ICD-10-CM | POA: Diagnosis not present

## 2018-06-15 DIAGNOSIS — E010 Iodine-deficiency related diffuse (endemic) goiter: Secondary | ICD-10-CM | POA: Diagnosis not present

## 2018-07-05 DIAGNOSIS — G4733 Obstructive sleep apnea (adult) (pediatric): Secondary | ICD-10-CM | POA: Diagnosis not present

## 2018-08-04 DIAGNOSIS — G4733 Obstructive sleep apnea (adult) (pediatric): Secondary | ICD-10-CM | POA: Diagnosis not present

## 2018-09-04 DIAGNOSIS — G4733 Obstructive sleep apnea (adult) (pediatric): Secondary | ICD-10-CM | POA: Diagnosis not present

## 2018-11-10 ENCOUNTER — Emergency Department
Admission: EM | Admit: 2018-11-10 | Discharge: 2018-11-10 | Disposition: A | Discharge status: Home / Self Care | Payer: BC Managed Care – PPO | Attending: Emergency Medicine | Admitting: Emergency Medicine

## 2018-11-10 ENCOUNTER — Other Ambulatory Visit: Payer: Self-pay

## 2018-11-10 ENCOUNTER — Encounter: Payer: Self-pay | Admitting: Emergency Medicine

## 2018-11-10 DIAGNOSIS — X503XXA Overexertion from repetitive movements, initial encounter: Secondary | ICD-10-CM | POA: Insufficient documentation

## 2018-11-10 DIAGNOSIS — Y939 Activity, unspecified: Secondary | ICD-10-CM | POA: Insufficient documentation

## 2018-11-10 DIAGNOSIS — S91001A Unspecified open wound, right ankle, initial encounter: Secondary | ICD-10-CM | POA: Insufficient documentation

## 2018-11-10 DIAGNOSIS — Y9259 Other trade areas as the place of occurrence of the external cause: Secondary | ICD-10-CM | POA: Insufficient documentation

## 2018-11-10 DIAGNOSIS — Y990 Civilian activity done for income or pay: Secondary | ICD-10-CM | POA: Insufficient documentation

## 2018-11-10 MED ORDER — SULFAMETHOXAZOLE-TRIMETHOPRIM 800-160 MG PO TABS: 1.0000 | ORAL_TABLET | Freq: Two times a day (BID) | ORAL | 0 refills | Status: DC

## 2018-11-10 NOTE — ED Provider Notes (Signed)
[facility] Emergency Department Provider Note  ____________________________________________  Time seen: Approximately 3:51 PM  I have reviewed the triage vital signs and the nursing notes.   HISTORY  Chief Complaint Ankle Pain    HPI Alan Leblanc is a 58 y.o. male presents to the emergency department for evaluation of wound to left ankle for 3 days.  Patient states that he usually uses long boots for work but recently got short boots.  Boots rubbed against his ankle and created a small wound.  His wife has been applying peroxide.  She is also been using herbs on wound.  He has been keeping it covered with a bandage.  Area is not painful.  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  No fevers.   Past Medical History:  Diagnosis Date  . Anemia   . Broken neck ([place]) 2003  . Cholecystitis   . GERD (gastroesophageal reflux disease)   . Rib fractures 2003  . Skull fracture ([place]) 2003  . Sleep apnea     Patient Active Problem List   Diagnosis Date Noted  . Grade IV hemorrhoids   . Constipation 09/22/2015  . Reflux esophagitis   . Hiatal hernia   . History of colonic polyps   . First degree hemorrhoids   . Hematochezia 04/15/2015  . GERD (gastroesophageal reflux disease) 04/15/2015  . Anemia 04/15/2015  . HEMORRHAGE OF RECTUM AND ANUS 10/16/2007  . ABDOMINAL PAIN, EPIGASTRIC 10/16/2007    Past Surgical History:  Procedure Laterality Date  . APPENDECTOMY    . CHOLECYSTECTOMY    . COLONOSCOPY    . COLONOSCOPY N/A 05/07/2015   Procedure: COLONOSCOPY;  Surgeon: Daneil Dolin, MD;  [facility];  Service: Endoscopy;  Laterality: N/A;  0900  . ESOPHAGOGASTRODUODENOSCOPY N/A 05/07/2015   Procedure: ESOPHAGOGASTRODUODENOSCOPY (EGD);  Surgeon: Daneil Dolin, MD;  Location: [facility];  Service: Endoscopy;  Laterality: N/A;  . HEMORRHOID SURGERY N/A 08/31/2016   Procedure: EXTENSIVE  HEMORRHOIDECTOMY;  Surgeon: Aviva Signs, MD;  Location: [facility] ORS;  Service: General;  Laterality: N/A;  . LEG SURGERY Right     right ankle fx  . VENTRAL HERNIA REPAIR N/A 12/07/2015   Procedure: HERNIA REPAIR VENTRAL ADULT WITH MESH;  Surgeon: Aviva Signs, MD;  Location: [facility] ORS;  Service: General;  Laterality: N/A;    Prior to Admission medications   Medication Sig Start Date End Date Taking? Authorizing Provider  CARAFATE 1 GM/10ML suspension Take 4 Doses by mouth as needed.  08/17/16   [provider]  docusate sodium (COLACE) 100 MG capsule Take 1 capsule (100 mg total) by mouth every 12 (twelve) hours. Patient taking differently: Take 100 mg by mouth daily as needed.  08/30/16   Orpah Greek, MD  pantoprazole (PROTONIX) 40 MG tablet Take 1 tablet (40 mg total) by mouth daily as needed (for acid reflux). 06/12/17   Carlis Stable, NP  sulfamethoxazole-trimethoprim (BACTRIM DS) 800-160 MG tablet Take 1 tablet by mouth 2 (two) times daily. 11/10/18   Laban Emperor, PA-C    Allergies Penicillins  Family History  Problem Relation Age of Onset  . Osteoarthritis Mother   . Tuberculosis Father   . Colon cancer Neg Hx        Limited knowledge of family history    Social History Social History   Tobacco Use  . Smoking status: Never Smoker  . Smokeless tobacco: Never Used  Substance Use Topics  . Alcohol use: No    Alcohol/week: 0.0 standard drinks  . Drug use: No     Review of Systems  Constitutional: No fever/chills Gastrointestinal: No nausea, no vomiting.  Musculoskeletal: Negative for musculoskeletal pain. Skin: Negative for lacerations, ecchymosis.  Positive for abrasion. Neurological: Negative for numbness or tingling   ____________________________________________   PHYSICAL EXAM:  VITAL SIGNS: ED Triage Vitals  Enc Vitals Group     BP 11/10/18 1519 101/89     Pulse Rate 11/10/18 1519 62     Resp 11/10/18 1519 20     Temp 11/10/18 1519 97.9 F  (36.6 C)     Temp Source 11/10/18 1519 Oral     SpO2 11/10/18 1519 97 %     Weight 11/10/18 1520 173 lb (78.5 kg)     Height 11/10/18 1520 5\' 6"  (1.676 m)     Head Circumference --      Peak Flow --      Pain Score 11/10/18 1520 0     Pain Loc --      Pain Edu? --      Excl. in [place]? --      Constitutional: Alert and oriented. Well appearing and in no acute distress. Eyes: Conjunctivae are normal. PERRL. EOMI. Head: Atraumatic. ENT:      Ears:      Nose: No congestion/rhinnorhea.      Mouth/Throat: Mucous membranes are moist.  Neck: No stridor.  Cardiovascular: Normal rate, regular rhythm.  Good peripheral circulation. Respiratory: Normal respiratory effort without tachypnea or retractions. Lungs CTAB. Good air entry to the bases with no decreased or absent breath sounds. Musculoskeletal: Full range of motion to all extremities. No gross deformities appreciated. Full range of motion of ankle without pain. Neurologic:  Normal speech and language. No gross focal neurologic deficits are appreciated.  Skin:  Skin is warm, dry and intact. No rash noted.  Multiple well-healed scars to right ankle.  1/ ra2 cm abrasion to medial ankle with overlying scab.  Very minimal surrounding erythema.  No drainage.  No swelling. Psychiatric: Mood and affect are normal. Speech and behavior are normal. Patient exhibits appropriate insight and judgement.   ____________________________________________   LABS (all labs ordered are listed, but only abnormal results are displayed)  Labs Reviewed - No data to display ____________________________________________  EKG   ____________________________________________  RADIOLOGY  No results found.  ____________________________________________    PROCEDURES  Procedure(s) performed:    Procedures    Medications - No data to display   ____________________________________________   INITIAL IMPRESSION / ASSESSMENT AND PLAN / ED  COURSE  Pertinent labs & imaging results that were available during my care of the patient were reviewed by me and considered in my medical decision making (see chart for details).  Review of the [place] CSRS was performed in accordance of the [place] prior to dispensing any controlled drugs.  Patient presented to the emergency department for evaluation of ankle wound.  Vital signs and exam are reassuring.  Patient does have some very minimal surrounding erythema to a healing ankle wound.  He will be started on Bactrim for potential infection, although there is a very low suspicion for infection currently.  Patient appears well.  He is nontoxic.  No indication for sepsis or septic joint.  Patient will be discharged home with prescriptions for Bactrim. Patient is to follow up with primary care as directed. Patient is given ED precautions to return to the ED for  any worsening or new symptoms.  Alan Leblanc was evaluated in Emergency Department on 11/10/2018 for the symptoms described in the history of present illness. He was evaluated in the context of the global COVID-19 pandemic, which necessitated consideration that the patient might be at risk for infection with the SARS-CoV-2 virus that causes COVID-19. Institutional protocols and algorithms that pertain to the evaluation of patients at risk for COVID-19 are in a state of rapid change based on information released by regulatory bodies including the CDC and federal and state organizations. These policies and algorithms were followed during the patient's care in the ED.  ____________________________________________  FINAL CLINICAL IMPRESSION(S) / ED DIAGNOSES  Final diagnoses:  Wound of right ankle, initial encounter      NEW MEDICATIONS STARTED DURING THIS VISIT:  ED Discharge Orders         Ordered    sulfamethoxazole-trimethoprim (BACTRIM DS) 800-160 MG tablet  2 times daily,   Status:  Discontinued     11/10/18 1559     sulfamethoxazole-trimethoprim (BACTRIM DS) 800-160 MG tablet  2 times daily     11/10/18 1611              This chart was dictated using voice recognition software/Dragon. Despite best efforts to proofread, errors can occur which can change the meaning. Any change was purely unintentional.    Laban Emperor, PA-C 11/10/18 2327    Nena Polio, MD 11/10/18 2350

## 2018-11-10 NOTE — ED Triage Notes (Signed)
States was wearing new boots and 3 days ago.noted rubbed wound on ankle wear boot rubbed. concerned to get treated early due to having osteomyelitis as teenager.

## 2019-01-22 DIAGNOSIS — Z6829 Body mass index (BMI) 29.0-29.9, adult: Secondary | ICD-10-CM | POA: Diagnosis not present

## 2019-01-22 DIAGNOSIS — G4733 Obstructive sleep apnea (adult) (pediatric): Secondary | ICD-10-CM | POA: Diagnosis not present

## 2019-01-22 DIAGNOSIS — E663 Overweight: Secondary | ICD-10-CM | POA: Diagnosis not present

## 2019-01-22 DIAGNOSIS — F330 Major depressive disorder, recurrent, mild: Secondary | ICD-10-CM | POA: Diagnosis not present

## 2019-01-22 DIAGNOSIS — D649 Anemia, unspecified: Secondary | ICD-10-CM | POA: Diagnosis not present

## 2019-01-22 DIAGNOSIS — Z0001 Encounter for general adult medical examination with abnormal findings: Secondary | ICD-10-CM | POA: Diagnosis not present

## 2019-01-22 DIAGNOSIS — E6609 Other obesity due to excess calories: Secondary | ICD-10-CM | POA: Diagnosis not present

## 2019-01-22 DIAGNOSIS — E291 Testicular hypofunction: Secondary | ICD-10-CM | POA: Diagnosis not present

## 2019-01-22 DIAGNOSIS — E079 Disorder of thyroid, unspecified: Secondary | ICD-10-CM | POA: Diagnosis not present

## 2019-02-05 DIAGNOSIS — Z0001 Encounter for general adult medical examination with abnormal findings: Secondary | ICD-10-CM | POA: Diagnosis not present

## 2019-02-05 DIAGNOSIS — G4733 Obstructive sleep apnea (adult) (pediatric): Secondary | ICD-10-CM | POA: Diagnosis not present

## 2019-02-05 DIAGNOSIS — N529 Male erectile dysfunction, unspecified: Secondary | ICD-10-CM | POA: Diagnosis not present

## 2019-02-05 DIAGNOSIS — D509 Iron deficiency anemia, unspecified: Secondary | ICD-10-CM | POA: Diagnosis not present

## 2019-02-07 DIAGNOSIS — M25561 Pain in right knee: Secondary | ICD-10-CM | POA: Diagnosis not present

## 2019-02-28 ENCOUNTER — Other Ambulatory Visit: Payer: Self-pay

## 2019-02-28 ENCOUNTER — Emergency Department
Admission: EM | Admit: 2019-02-28 | Discharge: 2019-02-28 | Disposition: A | Discharge status: Home / Self Care | Payer: BC Managed Care – PPO | Attending: Emergency Medicine | Admitting: Emergency Medicine

## 2019-02-28 ENCOUNTER — Encounter: Payer: Self-pay | Admitting: Emergency Medicine

## 2019-02-28 DIAGNOSIS — R040 Epistaxis: Secondary | ICD-10-CM

## 2019-02-28 MED ORDER — OXYMETAZOLINE HCL 0.05 % NA SOLN
1.0000 | Freq: Once | NASAL | Status: AC
  Administered 2019-02-28: 1 via NASAL
  Filled 2019-02-28: qty 30

## 2019-02-28 NOTE — ED Notes (Signed)
See triage note  Presents with nose bleed  States he usually pulls his nasal hair  And may have a little bleeding  This am he developed some bleeding while in the shower    On arrival blood noted around left nare  Pt currently has left nare packed with paper

## 2019-02-28 NOTE — ED Provider Notes (Signed)
[facility] Emergency Department Provider Note  ____________________________________________   First MD Initiated Contact with Patient 02/28/19 [phone]     (approximate)  I have reviewed the triage vital signs and the nursing notes.   HISTORY  Chief Complaint Epistaxis   HPI Alan Leblanc is a 59 y.o. male Libby Maw to the ED with complaint displayed from his left nares after he pulled some nasal hairs out this morning.  Patient states that he developed bleeding while in the shower.  He then packed some paper in his left nares to help control the bleeding.  Patient denies any blood thinners or every day aspirin routine.  Patient denies any previous nosebleeds in the past.  Rates his pain as a 0/10.      Past Medical History:  Diagnosis Date  . Anemia   . Broken neck ([place]) 2003  . Cholecystitis   . GERD (gastroesophageal reflux disease)   . Rib fractures 2003  . Skull fracture ([place]) 2003  . Sleep apnea     Patient Active Problem List   Diagnosis Date Noted  . Grade IV hemorrhoids   . Constipation 09/22/2015  . Reflux esophagitis   . Hiatal hernia   . History of colonic polyps   . First degree hemorrhoids   . Hematochezia 04/15/2015  . GERD (gastroesophageal reflux disease) 04/15/2015  . Anemia 04/15/2015  . HEMORRHAGE OF RECTUM AND ANUS 10/16/2007  . ABDOMINAL PAIN, EPIGASTRIC 10/16/2007    Past Surgical History:  Procedure Laterality Date  . APPENDECTOMY    . CHOLECYSTECTOMY    . COLONOSCOPY    . COLONOSCOPY N/A 05/07/2015   Procedure: COLONOSCOPY;  Surgeon: Daneil Dolin, MD;  [facility];  Service: Endoscopy;  Laterality: N/A;  0900  . ESOPHAGOGASTRODUODENOSCOPY N/A 05/07/2015   Procedure: ESOPHAGOGASTRODUODENOSCOPY (EGD);  Surgeon: Daneil Dolin, MD;  [facility];  Service: Endoscopy;  Laterality: N/A;  . HEMORRHOID SURGERY N/A 08/31/2016   Procedure: EXTENSIVE HEMORRHOIDECTOMY;  Surgeon: Aviva Signs, MD;   Location: [facility] ORS;  Service: General;  Laterality: N/A;  . LEG SURGERY Right     right ankle fx  . VENTRAL HERNIA REPAIR N/A 12/07/2015   Procedure: HERNIA REPAIR VENTRAL ADULT WITH MESH;  Surgeon: Aviva Signs, MD;  Location: [facility] ORS;  Service: General;  Laterality: N/A;    Prior to Admission medications   Medication Sig Start Date End Date Taking? Authorizing Provider  CARAFATE 1 GM/10ML suspension Take 4 Doses by mouth as needed.  08/17/16   [provider]  pantoprazole (PROTONIX) 40 MG tablet Take 1 tablet (40 mg total) by mouth daily as needed (for acid reflux). 06/12/17   Carlis Stable, NP    Allergies Penicillins  Family History  Problem Relation Age of Onset  . Osteoarthritis Mother   . Tuberculosis Father   . Colon cancer Neg Hx        Limited knowledge of family history    Social History Social History   Tobacco Use  . Smoking status: Never Smoker  . Smokeless tobacco: Never Used  Substance Use Topics  . Alcohol use: No    Alcohol/week: 0.0 standard drinks  . Drug use: No    Review of Systems Constitutional: No fever/chills Eyes: No visual changes. ENT: Bleeding from left nares Cardiovascular: Denies chest pain. Respiratory: Denies shortness of breath. Skin: Negative for rash. Neurological: Negative for headaches, focal weakness or numbness. ____________________________________________   PHYSICAL EXAM:  VITAL SIGNS: ED Triage  Vitals  Enc Vitals Group     BP 02/28/19 0906 120/72     Pulse Rate 02/28/19 0906 (!) 56     Resp 02/28/19 0906 20     Temp 02/28/19 0906 98 F (36.7 C)     Temp Source 02/28/19 0906 Oral     SpO2 02/28/19 0906 100 %     Weight 02/28/19 0907 180 lb (81.6 kg)     Height 02/28/19 0907 5\' 6"  (1.676 m)     Head Circumference --      Peak Flow --      Pain Score 02/28/19 0907 0     Pain Loc --      Pain Edu? --      Excl. in [place]? --     Constitutional: Alert and oriented. Well appearing and in no acute distress. Eyes:  Conjunctivae are normal.  Head: Atraumatic. Nose: Right naris no bleeding is noted.  Left naris with paper and blood noted within the naris.  This was removed and no active bleeding was noted.  There is a pinpoint area on the septum anteriorly that shows evidence of bleeding but no active bleeding at this time. Mouth/Throat: Mucous membranes are moist.  Oropharynx non-erythematous.  No posterior blood noted. Neck: No stridor.   Cardiovascular: Normal rate, regular rhythm. Grossly normal heart sounds.  Good peripheral circulation. Respiratory: Normal respiratory effort.  No retractions. Lungs CTAB. Musculoskeletal: Moves upper and lower extremities without any difficulty.  Normal gait was noted. Neurologic:  Normal speech and language. No gross focal neurologic deficits are appreciated. No gait instability. Skin:  Skin is warm, dry and intact. No rash noted. Psychiatric: Mood and affect are normal. Speech and behavior are normal.  ____________________________________________   LABS (all labs ordered are listed, but only abnormal results are displayed)  Labs Reviewed - No data to display  PROCEDURES  Procedure(s) performed (including Critical Care):  Procedures   ____________________________________________   INITIAL IMPRESSION / ASSESSMENT AND PLAN / ED COURSE  As part of my medical decision making, I reviewed the following data within the electronic MEDICAL RECORD NUMBER Notes from prior ED visits and [place] Controlled Substance Database  [place] was evaluated in Emergency Department on 02/28/2019 for the symptoms described in the history of present illness. He was evaluated in the context of the global COVID-19 pandemic, which necessitated consideration that the patient might be at risk for infection with the SARS-CoV-2 virus that causes COVID-19. Institutional protocols and algorithms that pertain to the evaluation of patients at risk for COVID-19 are in a state of rapid change based  on information released by regulatory bodies including the CDC and federal and state organizations. These policies and algorithms were followed during the patient's care in the ED.  59 year old male presents to the ED with left sided nasal bleeding after he pulled some hairs from the inside of his nose.  Patient states that it started bleeding while he was in the shower.  He arrived to the ED with paper in his nose to help control bleeding.  After this was removed there was no active bleeding noted.  The point area from where the blood came from is noted on the anterior portion of the septum.  A cottonball soaked with Neo-Synephrine was placed in his nose had no bleeding for the remainder of his ED stay.  Patient was reassured.  Was given instructions not to pull any nose hairs in the future and also how to help control  his bleeding should it start.  His return to the emergency department if any urgent concerns.  He will follow-up with [facility] if any continued problems. ____________________________________________   FINAL CLINICAL IMPRESSION(S) / ED DIAGNOSES  Final diagnoses:  Left-sided epistaxis     ED Discharge Orders    None       Note:  This document was prepared using Dragon voice recognition software and may include unintentional dictation errors.    Johnn Hai, PA-C 02/28/19 1312    Duffy Bruce, MD 03/01/19 1140

## 2019-02-28 NOTE — ED Triage Notes (Addendum)
Patient to ER for c/o nose bleed to left nare after pulling hair out of nose. Denies being on blood thinner, but has been unable to get bleeding to stop.

## 2019-02-28 NOTE — Discharge Instructions (Addendum)
Follow-up with [facility] if any continued problems with your nose.  Do not continue to pull hair out of your nose which may increase your chances for bleeding.  If you began bleeding again apply pressure and hold for 15 minutes.  Return to the emergency department if any worsening of your symptoms.

## 2019-03-09 DIAGNOSIS — H6690 Otitis media, unspecified, unspecified ear: Secondary | ICD-10-CM | POA: Diagnosis not present

## 2019-03-09 DIAGNOSIS — J3481 Nasal mucositis (ulcerative): Secondary | ICD-10-CM | POA: Diagnosis not present

## 2019-05-03 ENCOUNTER — Encounter (HOSPITAL_COMMUNITY): Payer: Self-pay

## 2019-05-03 ENCOUNTER — Emergency Department (HOSPITAL_COMMUNITY)
Admission: EM | Admit: 2019-05-03 | Discharge: 2019-05-04 | Disposition: A | Discharge status: Home / Self Care | Payer: BC Managed Care – PPO | Attending: Emergency Medicine | Admitting: Emergency Medicine

## 2019-05-03 ENCOUNTER — Other Ambulatory Visit: Payer: Self-pay

## 2019-05-03 ENCOUNTER — Encounter (HOSPITAL_COMMUNITY): Payer: Self-pay | Admitting: Emergency Medicine

## 2019-05-03 ENCOUNTER — Ambulatory Visit (HOSPITAL_COMMUNITY)
Admission: EM | Admit: 2019-05-03 | Discharge: 2019-05-03 | Disposition: A | Discharge status: Other Acute Inpatient Hospital | Payer: BC Managed Care – PPO | Source: Home / Self Care

## 2019-05-03 ENCOUNTER — Emergency Department (HOSPITAL_COMMUNITY): Payer: BC Managed Care – PPO

## 2019-05-03 DIAGNOSIS — R0789 Other chest pain: Secondary | ICD-10-CM

## 2019-05-03 DIAGNOSIS — M542 Cervicalgia: Secondary | ICD-10-CM | POA: Diagnosis not present

## 2019-05-03 DIAGNOSIS — Z5321 Procedure and treatment not carried out due to patient leaving prior to being seen by health care provider: Secondary | ICD-10-CM | POA: Insufficient documentation

## 2019-05-03 DIAGNOSIS — M25512 Pain in left shoulder: Secondary | ICD-10-CM

## 2019-05-03 DIAGNOSIS — R079 Chest pain, unspecified: Secondary | ICD-10-CM | POA: Diagnosis not present

## 2019-05-03 LAB — BASIC METABOLIC PANEL
Anion gap: 8 (ref 5–15)
BUN: 10 mg/dL (ref 6–20)
CO2: 28 mmol/L (ref 22–32)
Calcium: 9.2 mg/dL (ref 8.9–10.3)
Chloride: 106 mmol/L (ref 98–111)
Creatinine, Ser: 0.62 mg/dL (ref 0.61–1.24)
GFR calc Af Amer: >60 mL/min (ref >60)
GFR calc non Af Amer: >60 mL/min (ref >60)
Glucose, Bld: 110 mg/dL — ABNORMAL HIGH (ref 70–99)
Potassium: 4.2 mmol/L (ref 3.5–5.1)
Sodium: 142 mmol/L (ref 135–145)

## 2019-05-03 LAB — CBC
HCT: 46.1 % (ref 39.0–52.0)
Hemoglobin: 15.3 g/dL (ref 13.0–17.0)
MCH: 31.6 pg (ref 26.0–34.0)
MCHC: 33.2 g/dL (ref 30.0–36.0)
MCV: 95.2 fL (ref 80.0–100.0)
Platelets: 243 10*3/uL (ref 150–400)
RBC: 4.84 MIL/uL (ref 4.22–5.81)
RDW: 12.4 % (ref 11.5–15.5)
WBC: 6.9 10*3/uL (ref 4.0–10.5)
nRBC: 0 % (ref 0.0–0.2)

## 2019-05-03 LAB — TROPONIN I (HIGH SENSITIVITY)
Troponin I (High Sensitivity): <2 ng/L (ref <18)
Troponin I (High Sensitivity): 2 ng/L (ref <18)

## 2019-05-03 MED ORDER — SODIUM CHLORIDE 0.9% FLUSH: 3.0000 mL | Freq: Once | INTRAVENOUS | Status: DC

## 2019-05-03 NOTE — ED Provider Notes (Signed)
[place]    CSN: [id number] Arrival date & time: 05/03/19  1643      History   Chief Complaint Chief Complaint  Patient presents with  . Shoulder Pain  . Neck Pain  . Fatigue    HPI Alan Leblanc is a 59 y.o. male.   Patient reports urgent care for evaluation of left neck, shoulder and arm pain since receiving the [employer] vaccine on 04/15/2019.  He is also reporting that today he was not feeling well and more fatigued and had some chest discomfort and nausea.  He reports the chest discomfort felt like a pressure in the center of his chest and he did have some nausea around this time as well.  He denies shortness of breath when this occurred.  He is continue to endorse some central to right-sided chest pain/discomfort in clinic.  Nausea is mostly resided.  He continues to endorse left neck, shoulder and arm pain here.  He reports that movement does change his neck and shoulder pain.  Patient also reports a headache that has been ongoing since left-sided pains.  He reports overall the pains on his left side of somewhat improved.  Reports the week after the injection there significantly worse and he had a lot of soreness.  Reports the pains are worse on some days now and better on others.  Overall he reports he came in today because today was a very bad day and he was not feeling well.  Patient denies any significant medical history.     Past Medical History:  Diagnosis Date  . Anemia   . Broken neck ([place]) 2003  . Cholecystitis   . GERD (gastroesophageal reflux disease)   . Rib fractures 2003  . Skull fracture ([place]) 2003  . Sleep apnea     Patient Active Problem List   Diagnosis Date Noted  . Grade IV hemorrhoids   . Constipation 09/22/2015  . Reflux esophagitis   . Hiatal hernia   . History of colonic polyps   . First degree hemorrhoids   . Hematochezia 04/15/2015  . GERD (gastroesophageal reflux disease) 04/15/2015  . Anemia 04/15/2015  .  HEMORRHAGE OF RECTUM AND ANUS 10/16/2007  . ABDOMINAL PAIN, EPIGASTRIC 10/16/2007    Past Surgical History:  Procedure Laterality Date  . APPENDECTOMY    . CHOLECYSTECTOMY    . COLONOSCOPY    . COLONOSCOPY N/A 05/07/2015   Procedure: COLONOSCOPY;  Surgeon: Daneil Dolin, MD;  [facility];  Service: Endoscopy;  Laterality: N/A;  0900  . ESOPHAGOGASTRODUODENOSCOPY N/A 05/07/2015   Procedure: ESOPHAGOGASTRODUODENOSCOPY (EGD);  Surgeon: Daneil Dolin, MD;  [facility];  Service: Endoscopy;  Laterality: N/A;  . HEMORRHOID SURGERY N/A 08/31/2016   Procedure: EXTENSIVE HEMORRHOIDECTOMY;  Surgeon: Aviva Signs, MD;  Location: [facility] ORS;  Service: General;  Laterality: N/A;  . LEG SURGERY Right     right ankle fx  . VENTRAL HERNIA REPAIR N/A 12/07/2015   Procedure: HERNIA REPAIR VENTRAL ADULT WITH MESH;  Surgeon: Aviva Signs, MD;  Location: [facility] ORS;  Service: General;  Laterality: N/A;       Home Medications    Prior to Admission medications   Medication Sig Start Date End Date Taking? Authorizing Provider  CARAFATE 1 GM/10ML suspension Take 4 Doses by mouth as needed.  08/17/16   [provider]  pantoprazole (PROTONIX) 40 MG tablet Take 1 tablet (40 mg total) by mouth daily as needed (for acid reflux).  06/12/17   Carlis Stable, NP    Family History Family History  Problem Relation Age of Onset  . Osteoarthritis Mother   . Tuberculosis Father   . Colon cancer Neg Hx        Limited knowledge of family history    Social History Social History   Tobacco Use  . Smoking status: Never Smoker  . Smokeless tobacco: Never Used  Substance Use Topics  . Alcohol use: No    Alcohol/week: 0.0 standard drinks  . Drug use: No     Allergies   Penicillins   Review of Systems Review of Systems   Physical Exam Triage Vital Signs ED Triage Vitals  Enc Vitals Group     BP 05/03/19 1706 117/74     Pulse Rate 05/03/19 1706 68     Resp 05/03/19 1706 16      Temp 05/03/19 1706 98 F (36.7 C)     Temp Source 05/03/19 1706 Oral     SpO2 05/03/19 1706 100 %     Weight --      Height --      Head Circumference --      Peak Flow --      Pain Score 05/03/19 1704 8     Pain Loc --      Pain Edu? --      Excl. in [place]? --    No data found.  Updated Vital Signs BP 117/74 (BP Location: Right Arm)   Pulse 68   Temp 98 F (36.7 C) (Oral)   Resp 16   SpO2 100%   Visual Acuity Right Eye Distance:   Left Eye Distance:   Bilateral Distance:    Right Eye Near:   Left Eye Near:    Bilateral Near:     Physical Exam Vitals and nursing note reviewed.  Constitutional:      General: He is not in acute distress.    Appearance: He is well-developed. He is not ill-appearing.  HENT:     Head: Normocephalic and atraumatic.  Eyes:     Conjunctiva/sclera: Conjunctivae normal.  Cardiovascular:     Rate and Rhythm: Normal rate and regular rhythm.     Heart sounds: No murmur.  Pulmonary:     Effort: Pulmonary effort is normal. No respiratory distress.     Breath sounds: Normal breath sounds.  Abdominal:     Palpations: Abdomen is soft.     Tenderness: There is no abdominal tenderness.  Musculoskeletal:     Cervical back: Neck supple.  Skin:    General: Skin is warm and dry.  Neurological:     Mental Status: He is alert.      UC Treatments / Results  Labs (all labs ordered are listed, but only abnormal results are displayed) Labs Reviewed - No data to display  EKG EKG normal sinus rhythm.  No ST elevation.  T wave inverted in V1.  Otherwise normal EKG  Radiology No results found.  Procedures Procedures (including critical care time)  Medications Ordered in UC Medications - No data to display  Initial Impression / Assessment and Plan / UC Course  I have reviewed the triage vital signs and the nursing notes.  Pertinent labs & imaging results that were available during my care of the patient were reviewed by me and considered in  my medical decision making (see chart for details).     #Chest discomfort #Shoulder pain Patient is a 59 year old presenting with left-sided  upper extremity pain as well as chest discomfort with associated nausea.  EKG was normal.  However given age with suspicious chest discomfort, nausea and likely disguised left-sided pain I discussed with patient that it would be best for him to report to emergency department for further evaluation with likely troponin draws.  I stressed that we cannot rule out heart attack with a normal EKG at this time.  Patient does agree to go to [facility] emergency department following discharge from the urgent care.  Given no ST elevation and stable vital signs do feel he can report via POV.   Final Clinical Impressions(s) / UC Diagnoses   Final diagnoses:  Chest discomfort  Acute pain of left shoulder     Discharge Instructions     Given your symptoms today I believe you need further evaluation in the Emergency Room. Please report there following discharge from urgent care    ED Prescriptions    None     PDMP not reviewed this encounter.   Purnell Shoemaker, PA-C 05/03/19 1846

## 2019-05-03 NOTE — Discharge Instructions (Addendum)
Given your symptoms today I believe you need further evaluation in the Emergency Room. Please report there following discharge from urgent care

## 2019-05-03 NOTE — ED Triage Notes (Signed)
Pt c/o left shoulder, neck and chest pain. Pain has been intermittent since receiving his [employer] covid vaccine x 3 weeks ago.

## 2019-05-03 NOTE — ED Notes (Signed)
Patient is being discharged from the Urgent [facility] and sent to the Emergency Department via personal vehicle by self. Per Provider Roland Rack, patient is stable but in need of higher level of care for further cardiac work up. Patient is aware and verbalizes understanding of plan of care.    Vitals:   05/03/19 1706  BP: 117/74  Pulse: 68  Resp: 16  Temp: 98 F (36.7 C)  SpO2: 100%

## 2019-05-03 NOTE — ED Triage Notes (Signed)
Pt reports left sided shoulder, neck, and back pain as well as fatigue ever since receiving the Wynetta Emery and [employer] Covid vaccination on 4/5. Reports muscle spasms as well as body aches

## 2019-05-04 DIAGNOSIS — M25512 Pain in left shoulder: Secondary | ICD-10-CM | POA: Diagnosis not present

## 2019-05-04 NOTE — ED Notes (Signed)
Pt states he is leaving and will follow up with PCP.

## 2019-06-15 IMAGING — US US THYROID
1 series · 14 of 25 positions shown · non-contrast
Comparison: None.

CLINICAL DATA: Thyromegaly on exam

EXAM:
THYROID ULTRASOUND
TECHNIQUE: Ultrasound examination of the thyroid gland and adjacent soft
tissues was performed.

[Series 1: us thyroid · 0.07mm/px · 14 of 56 slices shown]
[im 1/56]
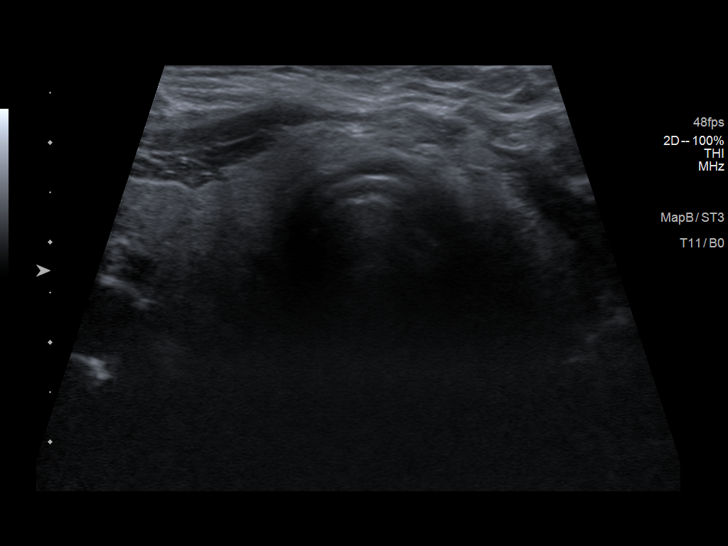
[im 5/56]
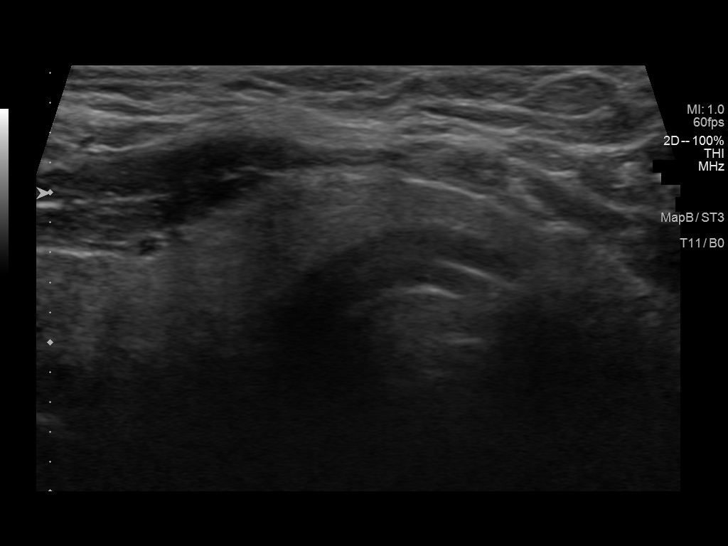
[im 10/56]
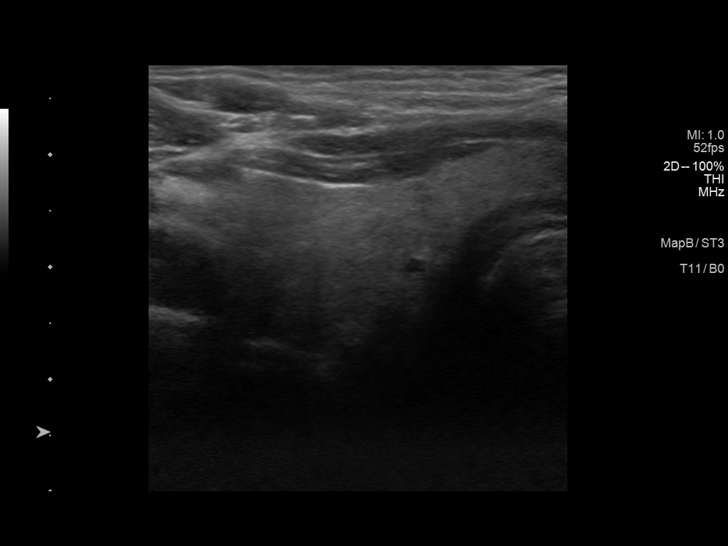
[im 14/56]
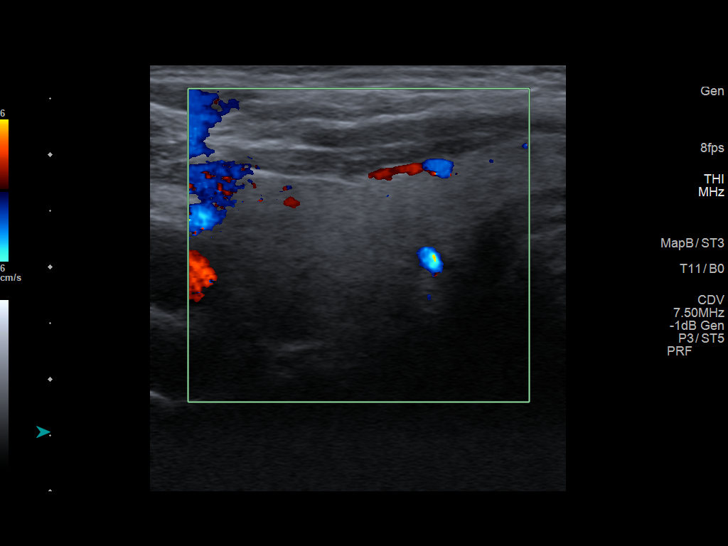
[im 19/56]
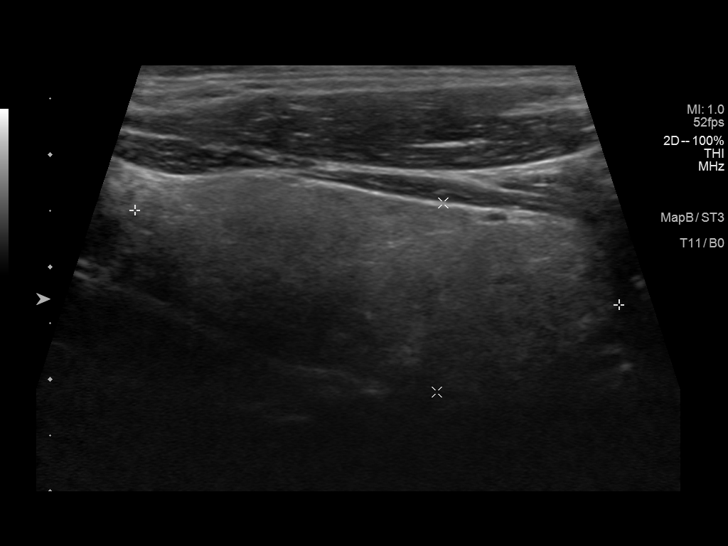
[im 21/56]
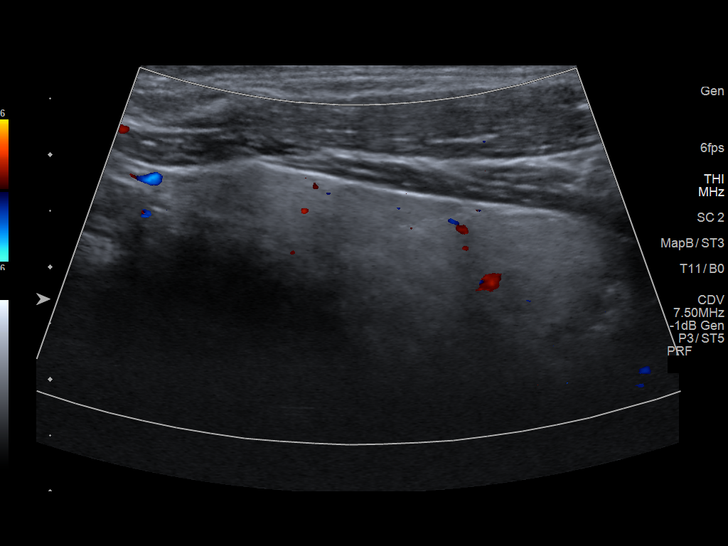
[im 26/56]
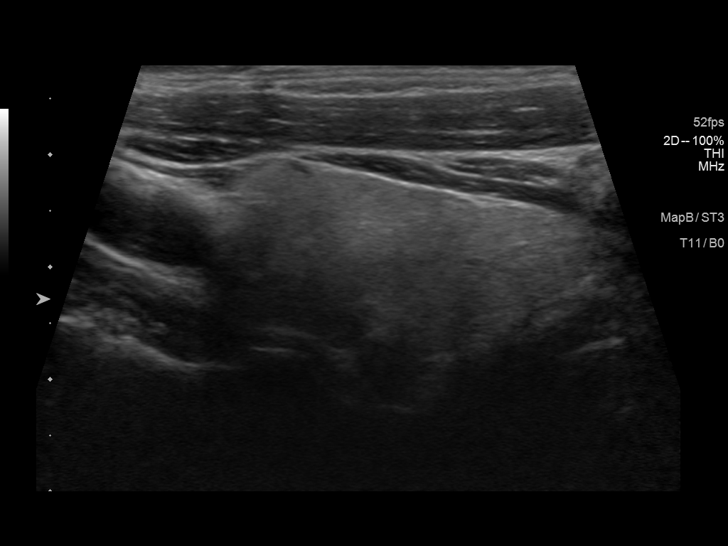
[im 30/56]
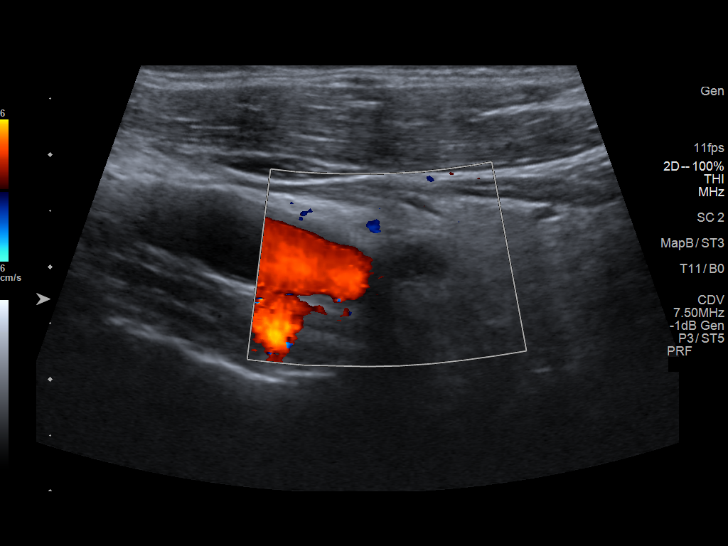
[im 35/56]
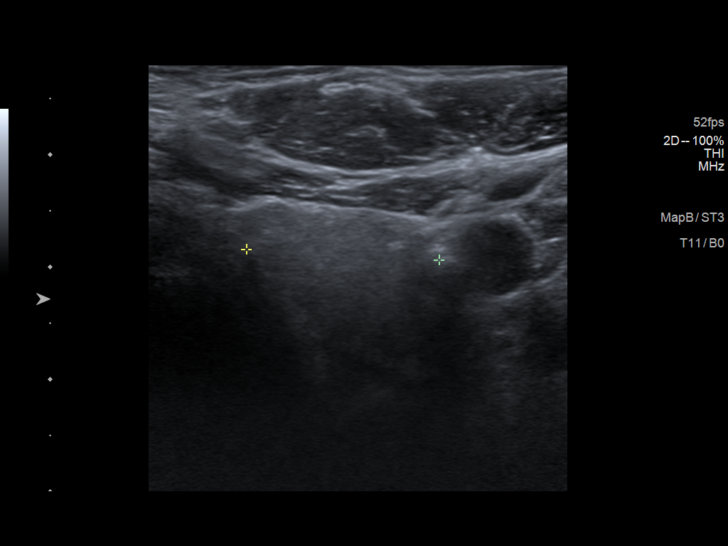
[im 37/56]
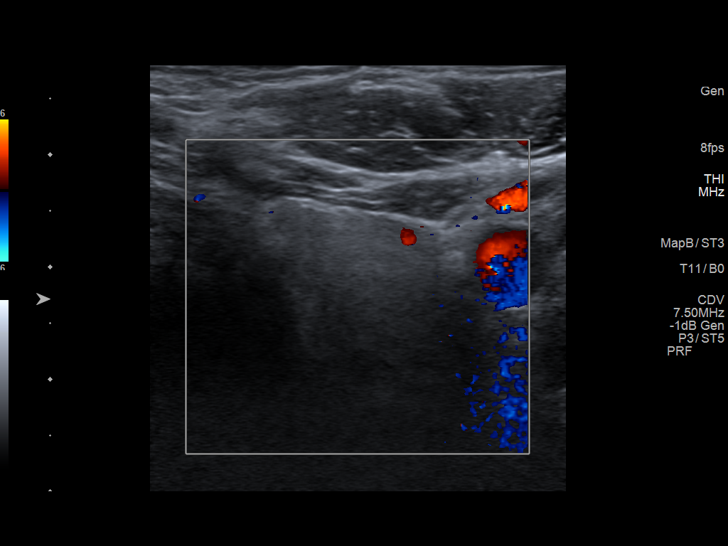
[im 42/56]
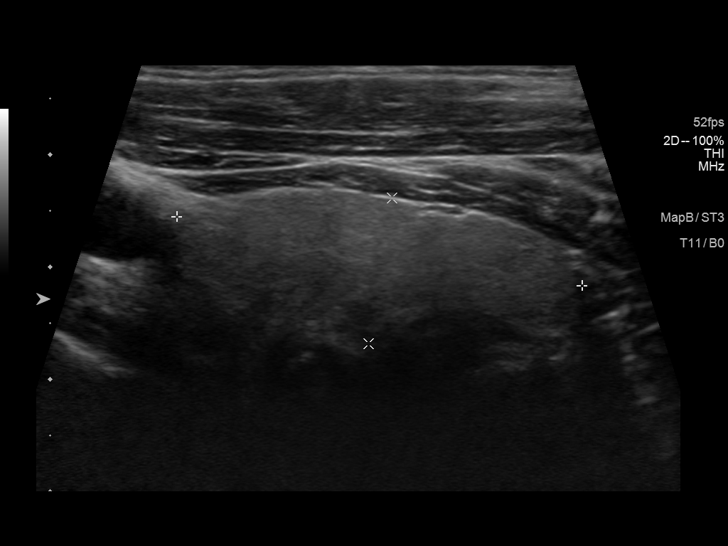
[im 46/56]
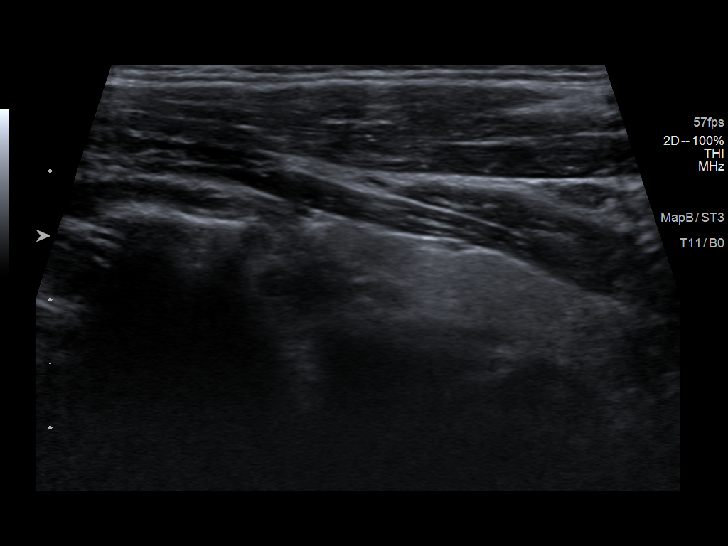
[im 51/56]
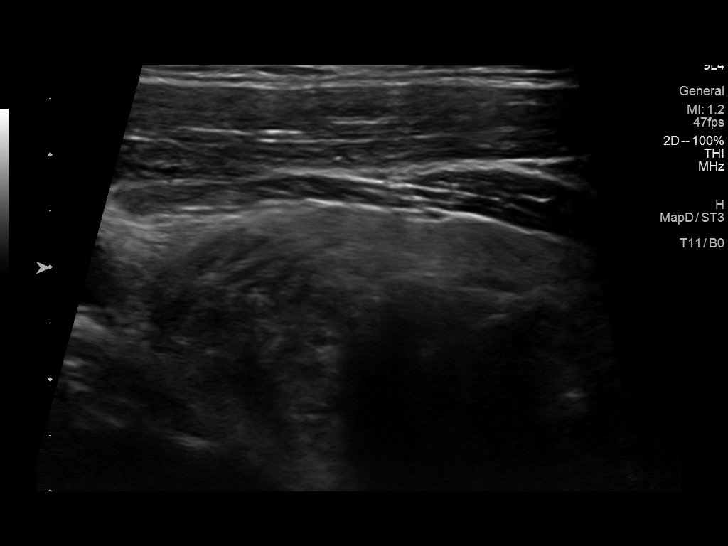
[im 56/56]
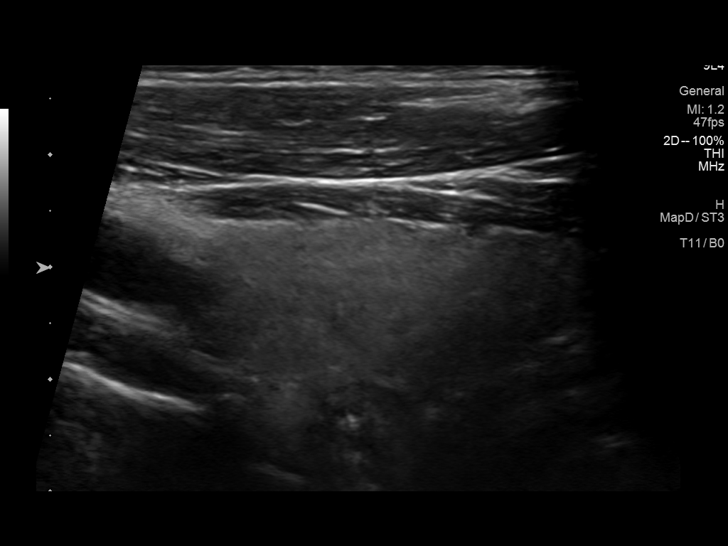

[14 of 25 positions shown; findings below may reference images not displayed]

FINDINGS: Parenchymal Echotexture: Normal

Isthmus: 4 mm

Right lobe: 4.4 x 1.7 x 2.0 cm

Left lobe: 3.7 x 1.3 x 1 cm

_________________________________________________________

Estimated total number of nodules >/= 1 cm: 0

Number of spongiform nodules >/=  2 cm not described below (TR1): 0

Number of mixed cystic and solid nodules >/= 1.5 cm not described
below (TR2): 0

_________________________________________________________

No discrete nodules are seen within the thyroid gland.
IMPRESSION: Normal Thyroid ultrasound for age

The above is in keeping with the ACR TI-RADS recommendations - [HOSPITAL] 9251;[DATE].

## 2019-06-27 ENCOUNTER — Ambulatory Visit: Payer: BC Managed Care – PPO | Admitting: Family Medicine

## 2019-07-03 DIAGNOSIS — F330 Major depressive disorder, recurrent, mild: Secondary | ICD-10-CM | POA: Diagnosis not present

## 2019-07-03 DIAGNOSIS — M25512 Pain in left shoulder: Secondary | ICD-10-CM | POA: Diagnosis not present

## 2019-07-03 DIAGNOSIS — E669 Obesity, unspecified: Secondary | ICD-10-CM | POA: Diagnosis not present

## 2019-07-03 DIAGNOSIS — E291 Testicular hypofunction: Secondary | ICD-10-CM | POA: Diagnosis not present

## 2019-07-03 DIAGNOSIS — E6609 Other obesity due to excess calories: Secondary | ICD-10-CM | POA: Diagnosis not present

## 2019-07-03 DIAGNOSIS — D649 Anemia, unspecified: Secondary | ICD-10-CM | POA: Diagnosis not present

## 2019-07-03 DIAGNOSIS — E663 Overweight: Secondary | ICD-10-CM | POA: Diagnosis not present

## 2019-07-03 DIAGNOSIS — E079 Disorder of thyroid, unspecified: Secondary | ICD-10-CM | POA: Diagnosis not present

## 2019-07-03 DIAGNOSIS — M545 Low back pain: Secondary | ICD-10-CM | POA: Diagnosis not present

## 2019-07-03 DIAGNOSIS — R350 Frequency of micturition: Secondary | ICD-10-CM | POA: Diagnosis not present

## 2019-07-03 DIAGNOSIS — D509 Iron deficiency anemia, unspecified: Secondary | ICD-10-CM | POA: Diagnosis not present

## 2019-07-03 DIAGNOSIS — B354 Tinea corporis: Secondary | ICD-10-CM | POA: Diagnosis not present

## 2019-07-09 DIAGNOSIS — E079 Disorder of thyroid, unspecified: Secondary | ICD-10-CM | POA: Diagnosis not present

## 2019-07-09 DIAGNOSIS — N529 Male erectile dysfunction, unspecified: Secondary | ICD-10-CM | POA: Diagnosis not present

## 2019-07-09 DIAGNOSIS — G4733 Obstructive sleep apnea (adult) (pediatric): Secondary | ICD-10-CM | POA: Diagnosis not present

## 2019-07-09 DIAGNOSIS — D509 Iron deficiency anemia, unspecified: Secondary | ICD-10-CM | POA: Diagnosis not present

## 2019-07-12 ENCOUNTER — Other Ambulatory Visit: Payer: Self-pay | Admitting: Internal Medicine

## 2019-07-12 ENCOUNTER — Other Ambulatory Visit (HOSPITAL_COMMUNITY): Payer: Self-pay | Admitting: Internal Medicine

## 2019-07-12 DIAGNOSIS — E049 Nontoxic goiter, unspecified: Secondary | ICD-10-CM

## 2019-07-12 DIAGNOSIS — E079 Disorder of thyroid, unspecified: Secondary | ICD-10-CM

## 2019-07-22 ENCOUNTER — Other Ambulatory Visit: Payer: Self-pay

## 2019-07-22 ENCOUNTER — Ambulatory Visit (HOSPITAL_COMMUNITY)
Admission: RE | Admit: 2019-07-22 | Discharge: 2019-07-22 | Disposition: A | Discharge status: Home / Self Care | Payer: BC Managed Care – PPO | Source: Ambulatory Visit | Attending: Internal Medicine | Admitting: Internal Medicine

## 2019-07-22 DIAGNOSIS — E049 Nontoxic goiter, unspecified: Secondary | ICD-10-CM | POA: Insufficient documentation

## 2019-07-22 DIAGNOSIS — E079 Disorder of thyroid, unspecified: Secondary | ICD-10-CM | POA: Diagnosis not present

## 2019-07-22 DIAGNOSIS — E042 Nontoxic multinodular goiter: Secondary | ICD-10-CM | POA: Diagnosis not present

## 2019-07-26 ENCOUNTER — Other Ambulatory Visit: Payer: Self-pay | Admitting: Internal Medicine

## 2019-07-26 ENCOUNTER — Other Ambulatory Visit (HOSPITAL_COMMUNITY): Payer: Self-pay | Admitting: Internal Medicine

## 2019-07-26 DIAGNOSIS — R221 Localized swelling, mass and lump, neck: Secondary | ICD-10-CM

## 2019-08-20 ENCOUNTER — Ambulatory Visit (HOSPITAL_COMMUNITY): Payer: BC Managed Care – PPO

## 2019-08-20 ENCOUNTER — Encounter (HOSPITAL_COMMUNITY): Payer: Self-pay

## 2019-09-05 DIAGNOSIS — Z20828 Contact with and (suspected) exposure to other viral communicable diseases: Secondary | ICD-10-CM | POA: Diagnosis not present

## 2020-05-02 IMAGING — CR DG CHEST 2V
2 series · 2 of 2 positions shown · non-contrast
Comparison: None.

CLINICAL DATA: Chest pain

EXAM:
CHEST - 2 VIEW

[chest pa]
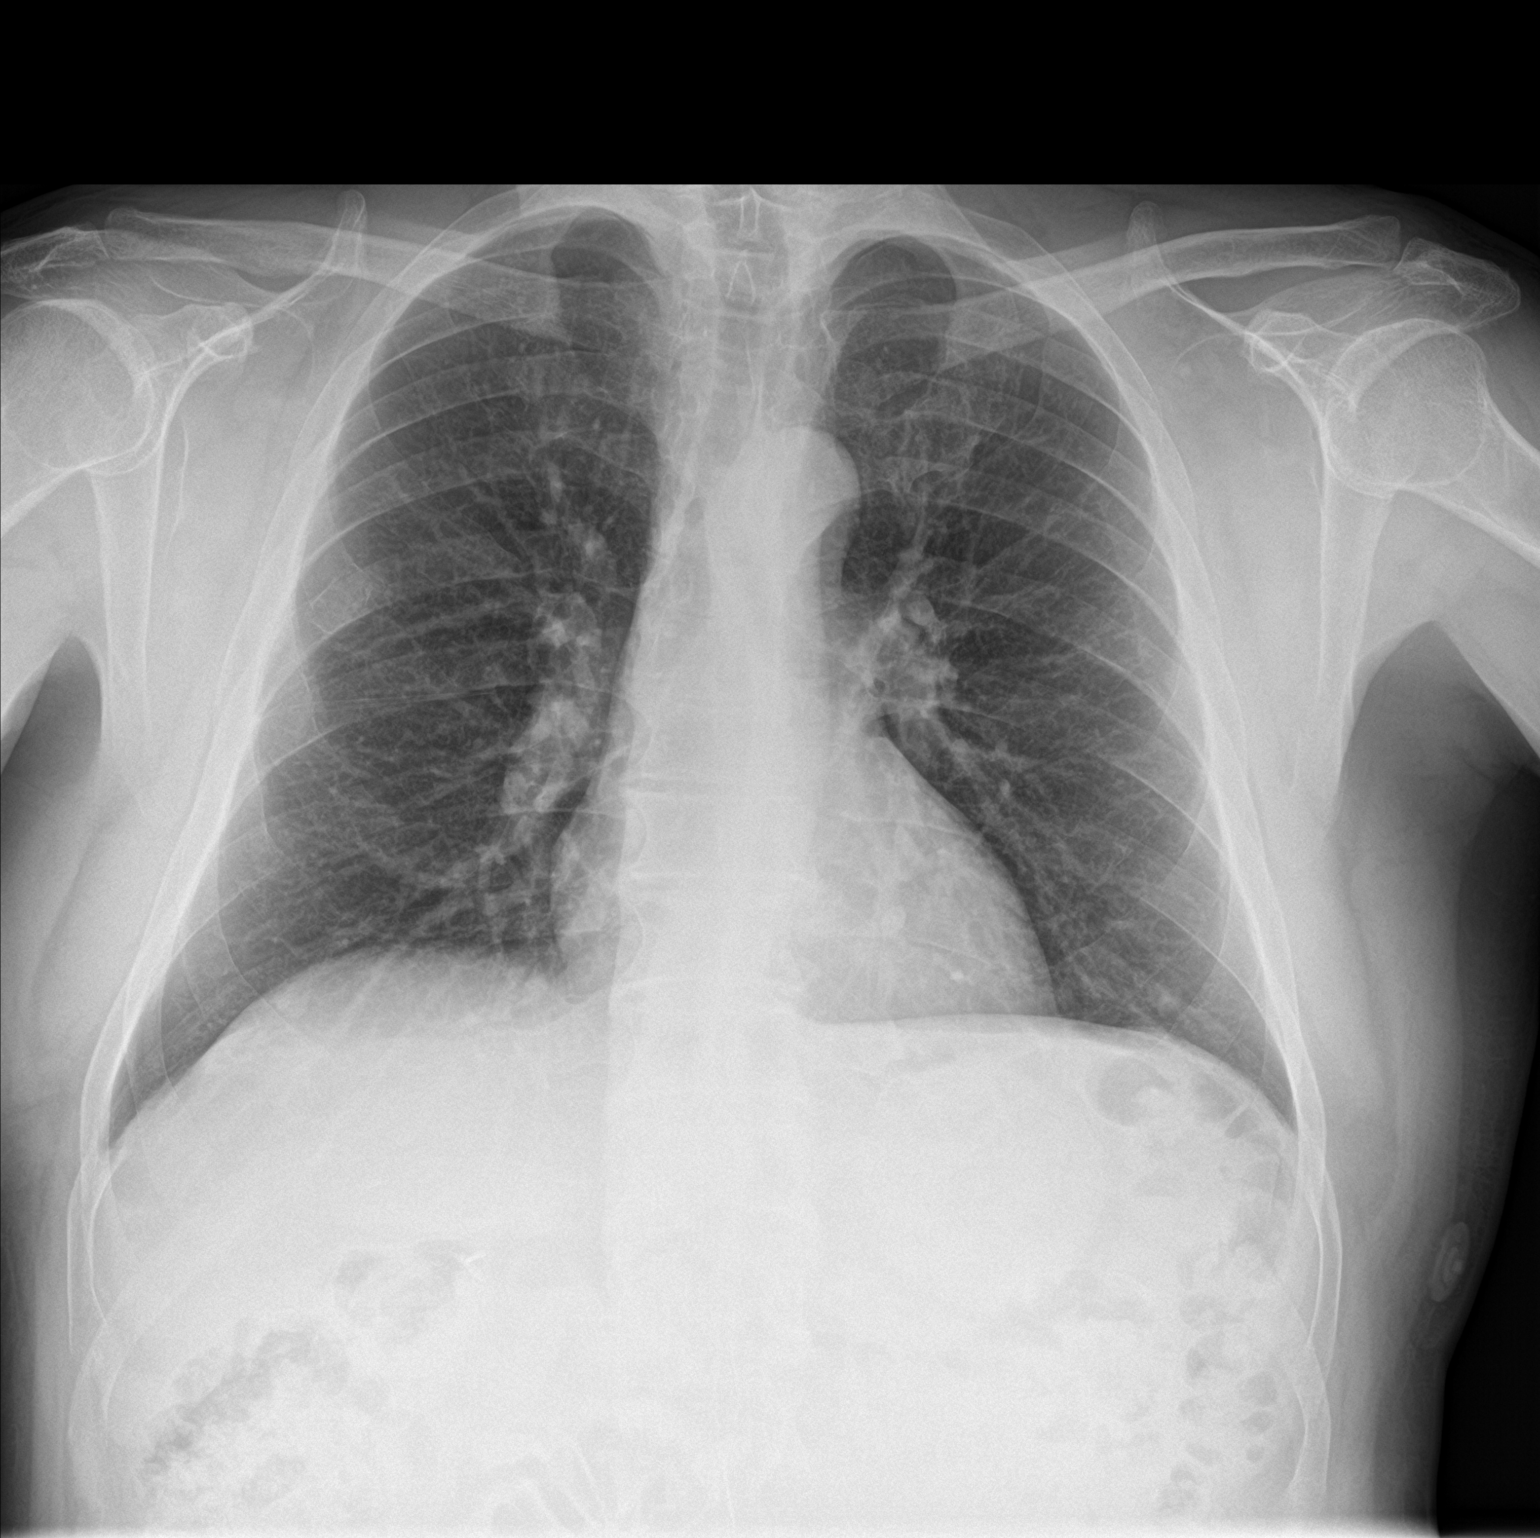

[chest lat]
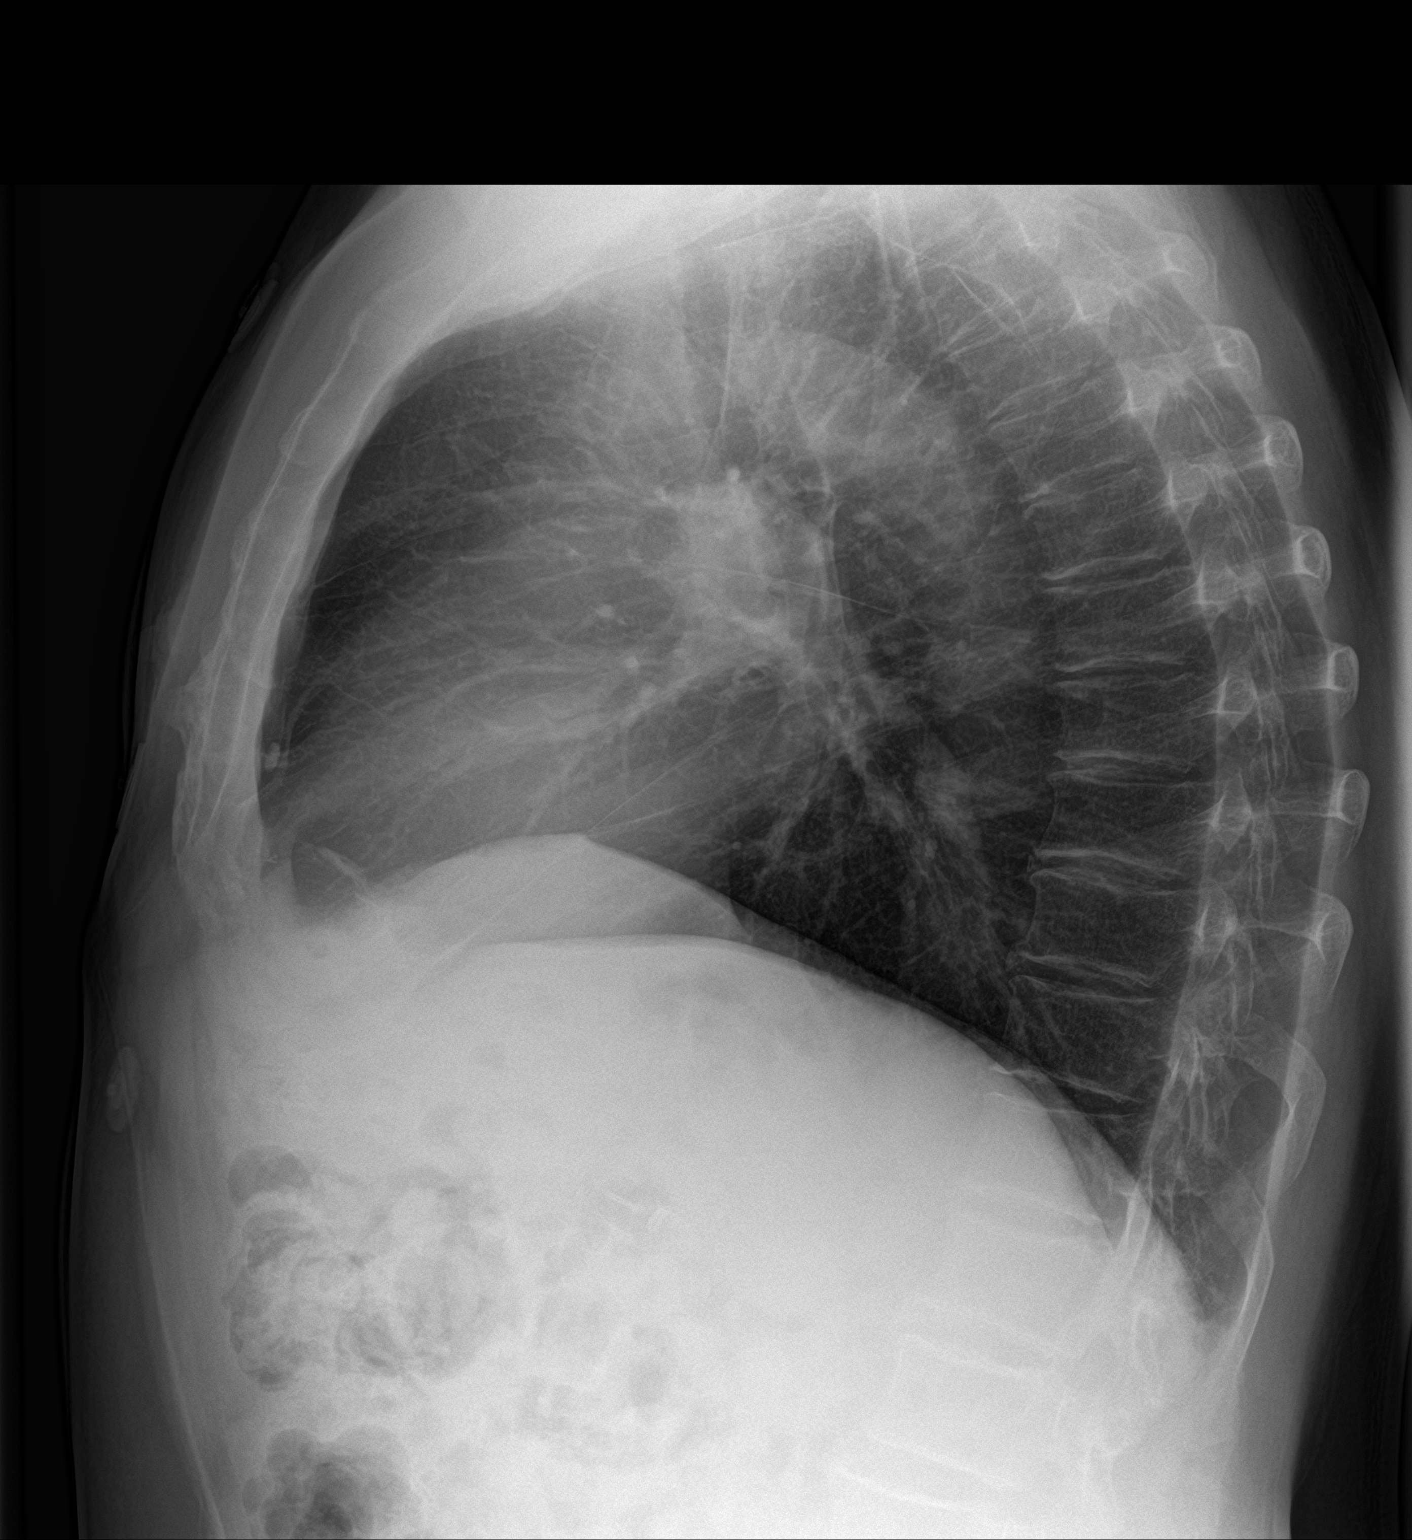

[2 of 2 positions shown; findings below may reference images not displayed]

FINDINGS: Healed right rib fractures. The heart, hila, mediastinum, lungs, and
pleura are otherwise normal.
IMPRESSION: No active cardiopulmonary disease.

## 2020-07-21 IMAGING — US US THYROID
1 series · 13 of 25 positions shown · non-contrast
Comparison: Prior thyroid ultrasound on 06/15/2018

CLINICAL DATA: Palpable abnormality.

EXAM:
THYROID ULTRASOUND
TECHNIQUE: Ultrasound examination of the thyroid gland and adjacent soft
tissues was performed.

[Series 1: us thyroid · 13 of 57 slices shown]
[im 1/57]
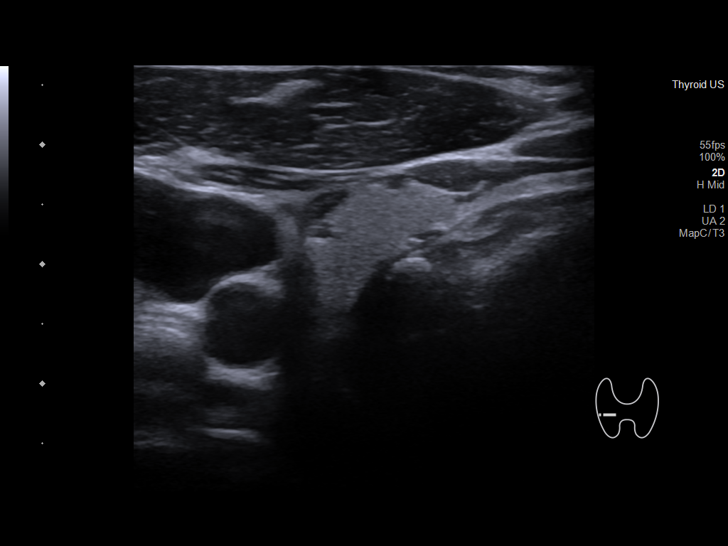
[im 5/57]
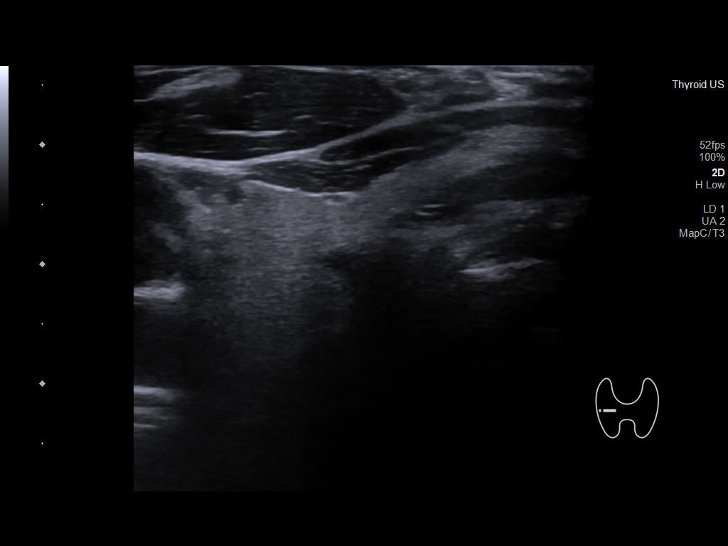
[im 10/57]
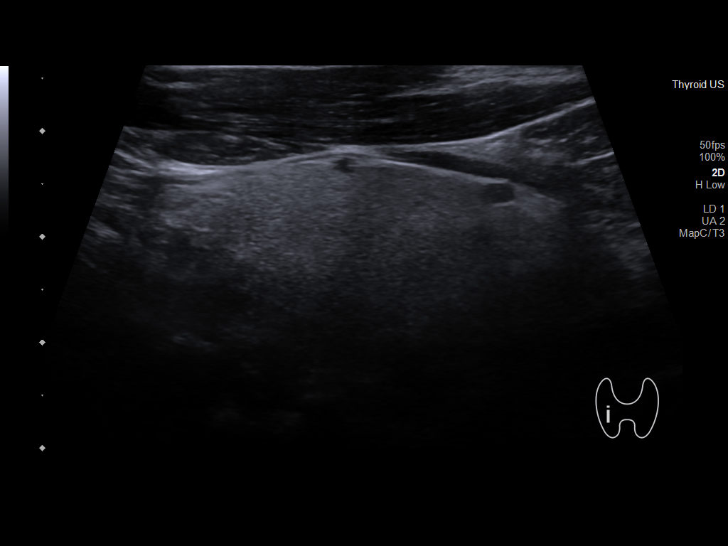
[im 15/57]
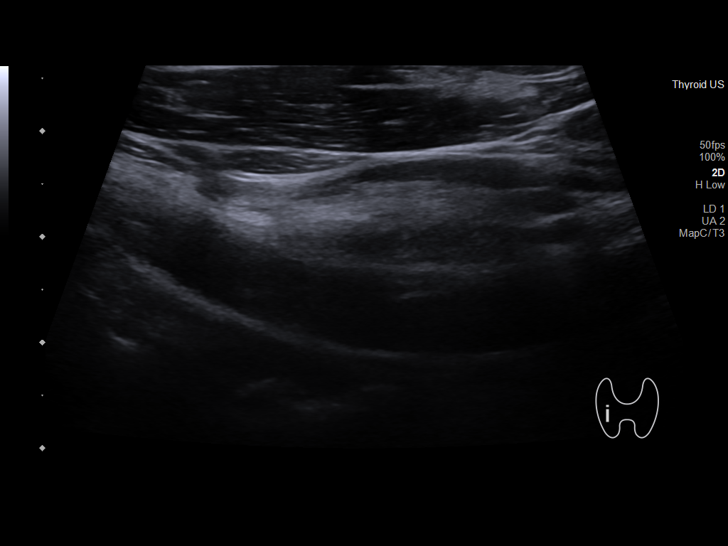
[im 19/57]
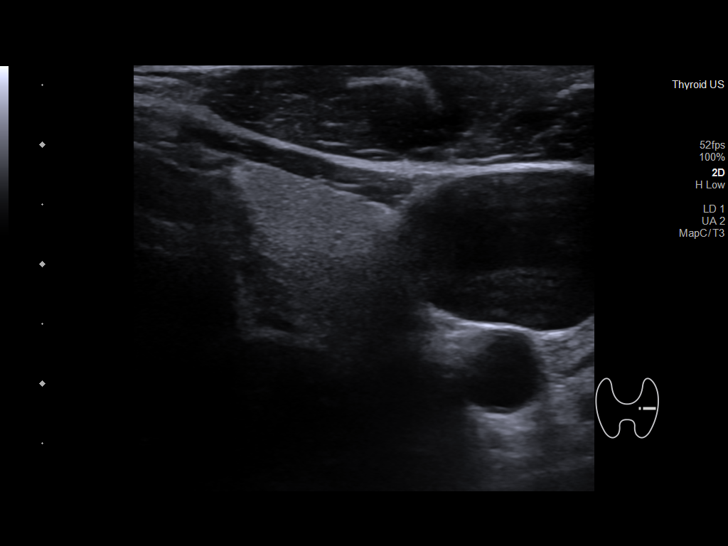
[im 24/57]
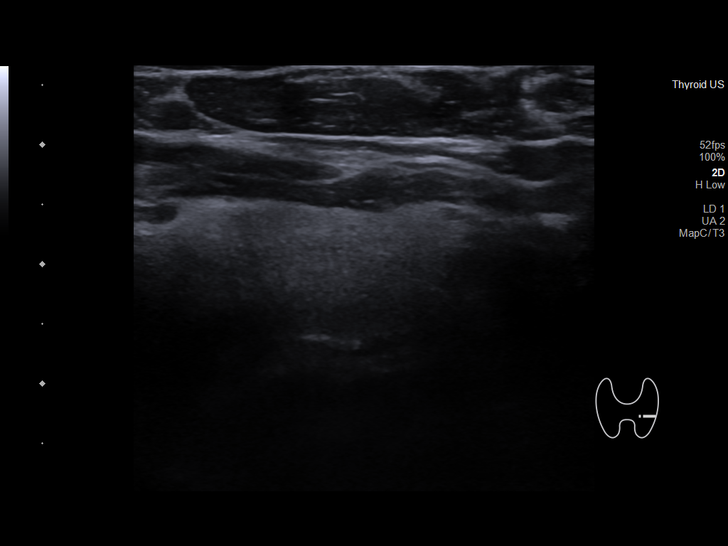
[im 29/57]
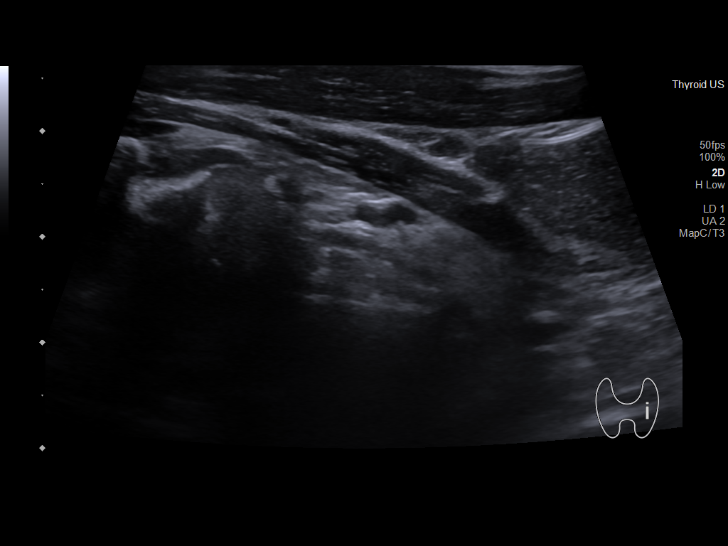
[im 33/57]
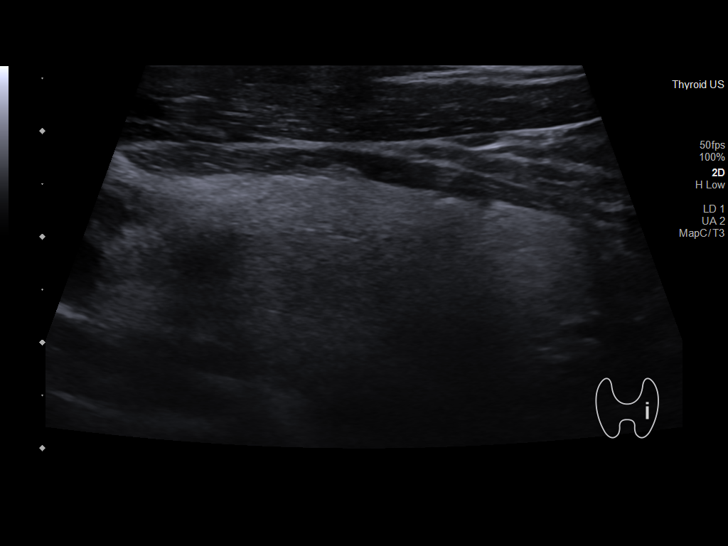
[im 38/57]
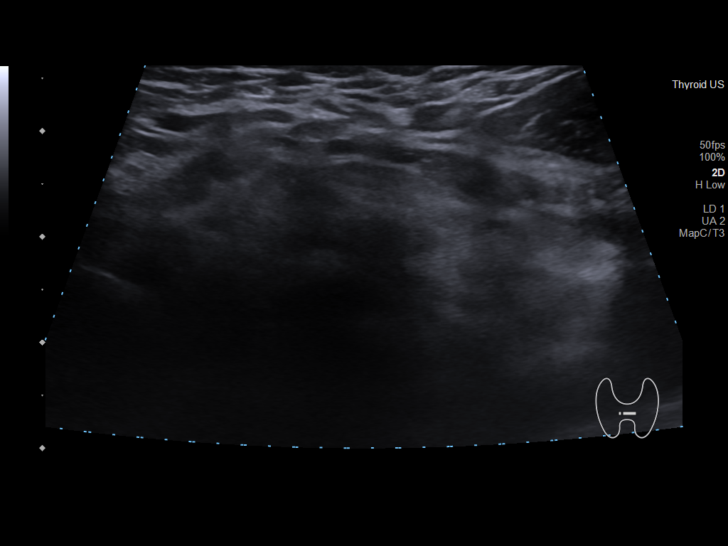
[im 43/57]
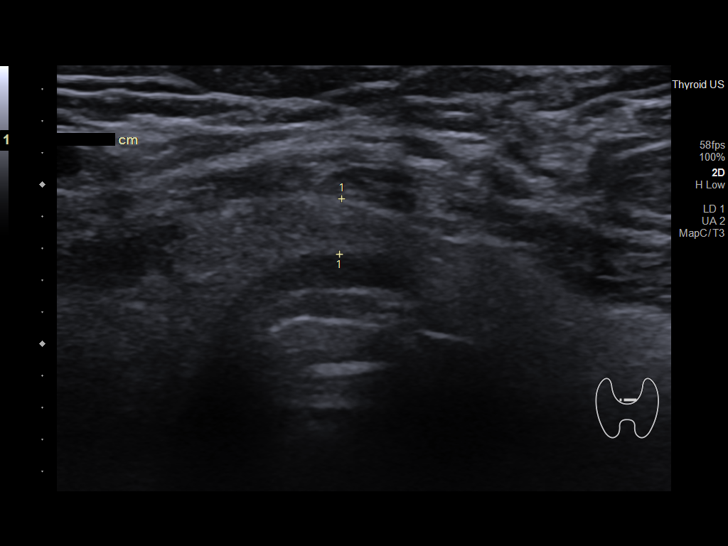
[im 47/57]
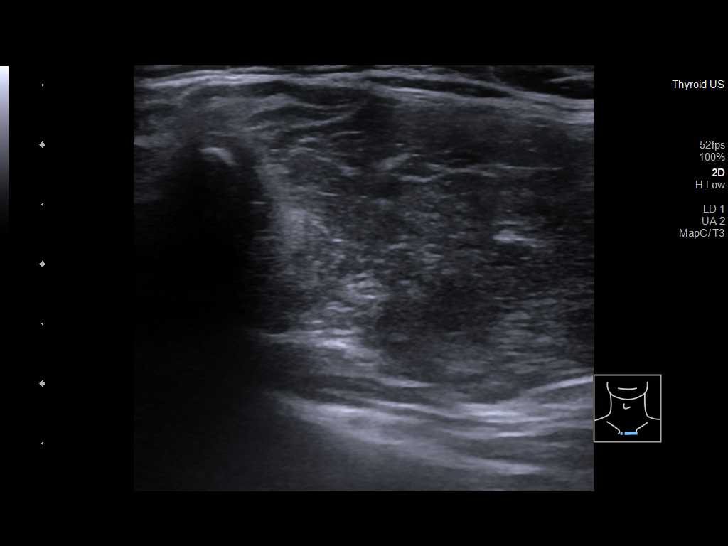
[im 52/57]
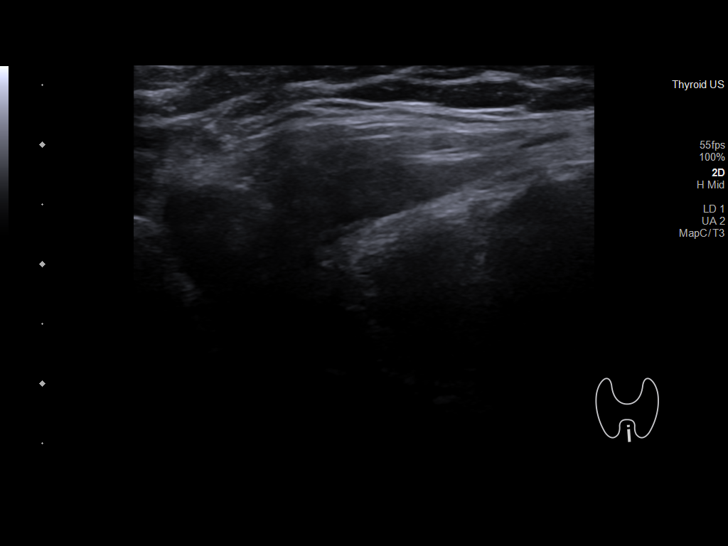
[im 57/57]
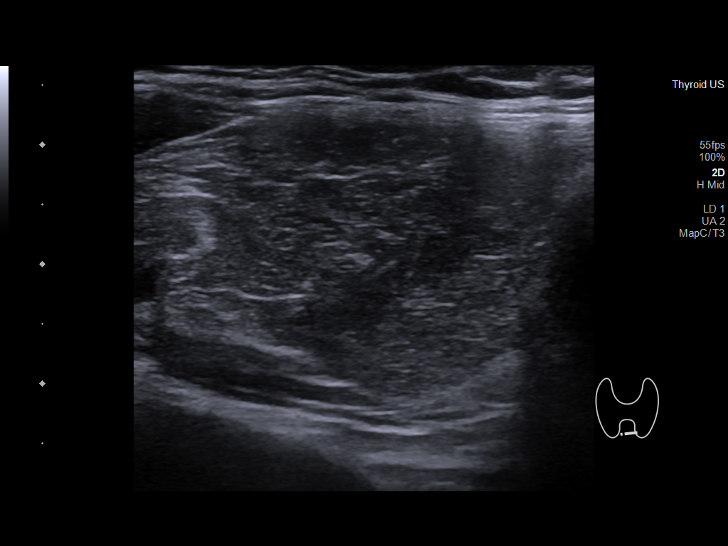

[13 of 25 positions shown; findings below may reference images not displayed]

FINDINGS: Parenchymal Echotexture: Normal

Isthmus: 0.4 cm

Right lobe: 4.2 x 1.9 x 1.5 cm

Left lobe: 4.1 x 2.3 x 2.0 cm

_________________________________________________________

Estimated total number of nodules >/= 1 cm: 0

Number of spongiform nodules >/=  2 cm not described below (TR1): 0

Number of mixed cystic and solid nodules >/= 1.5 cm not described
below (TR2): 0

_________________________________________________________

No discrete nodules are seen within the thyroid gland.

No abnormal lymph nodes identified.

Suprasternal soft tissue represents potential focal mass and
measures approximately 3.5 x 2.9 x 2.5 cm. This could potentially
represent a lipoma based on ultrasound but ultrasound
characteristics are nonspecific. This was not definitively seen or
characterized on the prior thyroid ultrasound. Recommend correlation
with CT of the neck and chest with IV contrast.
IMPRESSION: 1. Normal thyroid gland.
2. Focal soft tissue in the suprasternal region of the lower neck
inferior to the thyroid gland and measuring up to 3.5 cm. This could
potentially represent a lipoma but ultrasound characteristics are
nonspecific. Recommend correlation with CT of the neck and chest
with IV contrast.

The above is in keeping with the ACR TI-RADS recommendations - [HOSPITAL] 1353;[DATE].

## 2021-10-23 ENCOUNTER — Encounter (HOSPITAL_BASED_OUTPATIENT_CLINIC_OR_DEPARTMENT_OTHER): Payer: Self-pay | Admitting: Emergency Medicine

## 2021-10-23 ENCOUNTER — Other Ambulatory Visit: Payer: Self-pay

## 2021-10-23 ENCOUNTER — Emergency Department (HOSPITAL_BASED_OUTPATIENT_CLINIC_OR_DEPARTMENT_OTHER): Payer: Self-pay

## 2021-10-23 ENCOUNTER — Emergency Department (HOSPITAL_BASED_OUTPATIENT_CLINIC_OR_DEPARTMENT_OTHER)
Admission: EM | Admit: 2021-10-23 | Discharge: 2021-10-23 | Disposition: A | Discharge status: Home / Self Care | Payer: Self-pay | Attending: Emergency Medicine | Admitting: Emergency Medicine

## 2021-10-23 DIAGNOSIS — K2900 Acute gastritis without bleeding: Secondary | ICD-10-CM | POA: Insufficient documentation

## 2021-10-23 LAB — COMPREHENSIVE METABOLIC PANEL
ALT: 21 U/L (ref 0–44)
AST: 22 U/L (ref 15–41)
Albumin: 4.4 g/dL (ref 3.5–5.0)
Alkaline Phosphatase: 72 U/L (ref 38–126)
Anion gap: 9 (ref 5–15)
BUN: 13 mg/dL (ref 6–20)
CO2: 27 mmol/L (ref 22–32)
Calcium: 9.8 mg/dL (ref 8.9–10.3)
Chloride: 104 mmol/L (ref 98–111)
Creatinine, Ser: 0.72 mg/dL (ref 0.61–1.24)
GFR, Estimated: >60 mL/min (ref >60)
Glucose, Bld: 99 mg/dL (ref 70–99)
Potassium: 4.2 mmol/L (ref 3.5–5.1)
Sodium: 140 mmol/L (ref 135–145)
Total Bilirubin: 0.6 mg/dL (ref 0.3–1.2)
Total Protein: 7.8 g/dL (ref 6.5–8.1)

## 2021-10-23 LAB — CBC WITH DIFFERENTIAL/PLATELET
Abs Immature Granulocytes: 0.01 10*3/uL (ref 0.00–0.07)
Basophils Absolute: 0.1 10*3/uL (ref 0.0–0.1)
Basophils Relative: 1 %
Eosinophils Absolute: 0.2 10*3/uL (ref 0.0–0.5)
Eosinophils Relative: 3 %
HCT: 45.5 % (ref 39.0–52.0)
Hemoglobin: 15.4 g/dL (ref 13.0–17.0)
Immature Granulocytes: 0 %
Lymphocytes Relative: 23 %
Lymphs Abs: 1.4 10*3/uL (ref 0.7–4.0)
MCH: 31.4 pg (ref 26.0–34.0)
MCHC: 33.8 g/dL (ref 30.0–36.0)
MCV: 92.7 fL (ref 80.0–100.0)
Monocytes Absolute: 0.5 10*3/uL (ref 0.1–1.0)
Monocytes Relative: 8 %
Neutro Abs: 4 10*3/uL (ref 1.7–7.7)
Neutrophils Relative %: 65 %
Platelets: 224 10*3/uL (ref 150–400)
RBC: 4.91 MIL/uL (ref 4.22–5.81)
RDW: 12.6 % (ref 11.5–15.5)
WBC: 6.1 10*3/uL (ref 4.0–10.5)
nRBC: 0 % (ref 0.0–0.2)

## 2021-10-23 LAB — TROPONIN I (HIGH SENSITIVITY): Troponin I (High Sensitivity): 3 ng/L (ref <18)

## 2021-10-23 LAB — LIPASE, BLOOD: Lipase: 29 U/L (ref 11–51)

## 2021-10-23 MED ORDER — IOHEXOL 300 MG/ML  SOLN
100.0000 mL | Freq: Once | INTRAMUSCULAR | Status: AC | PRN
  Administered 2021-10-23: 100 mL via INTRAVENOUS

## 2021-10-23 MED ORDER — ACETAMINOPHEN 500 MG PO TABS
1000.0000 mg | ORAL_TABLET | Freq: Once | ORAL | Status: AC
  Administered 2021-10-23: 1000 mg via ORAL
  Filled 2021-10-23: qty 2

## 2021-10-23 MED ORDER — ALUM & MAG HYDROXIDE-SIMETH 200-200-20 MG/5ML PO SUSP
30.0000 mL | Freq: Once | ORAL | Status: AC
  Administered 2021-10-23: 30 mL via ORAL
  Filled 2021-10-23: qty 30

## 2021-10-23 NOTE — ED Provider Notes (Signed)
[facility] EMERGENCY DEPT Provider Note  CSN: [id number] Arrival date & time: 10/23/21 6789  Chief Complaint(s) Abdominal Pain  HPI Alan Leblanc is a 61 y.o. male with history of GERD, cholecystectomy presenting to the emergency department with epigastric pain.  He reports pain for 3 days.  He denies any associated vomiting, reports initially some mild nausea which is resolved.  No chest pain, shortness of breath, diaphoresis, weakness.  Pain does radiate to the back.  He describes it as a burning pain.  Reports symptoms are mild.  Has not taken anything for the pain.   Past Medical History Past Medical History:  Diagnosis Date   Anemia    Broken neck ([place]) 2003   Cholecystitis    GERD (gastroesophageal reflux disease)    Rib fractures 2003   Skull fracture ([place]) 2003   Sleep apnea    Patient Active Problem List   Diagnosis Date Noted   Grade IV hemorrhoids    Constipation 09/22/2015   Reflux esophagitis    Hiatal hernia    History of colonic polyps    First degree hemorrhoids    Hematochezia 04/15/2015   GERD (gastroesophageal reflux disease) 04/15/2015   Anemia 04/15/2015   HEMORRHAGE OF RECTUM AND ANUS 10/16/2007   ABDOMINAL PAIN, EPIGASTRIC 10/16/2007   Home Medication(s) Prior to Admission medications   Medication Sig Start Date End Date Taking? Authorizing Provider  CARAFATE 1 GM/10ML suspension Take 4 Doses by mouth as needed.  08/17/16   [provider]  pantoprazole (PROTONIX) 40 MG tablet Take 1 tablet (40 mg total) by mouth daily as needed (for acid reflux). 06/12/17   Carlis Stable, NP                                                                                                                                    Past Surgical History Past Surgical History:  Procedure Laterality Date   APPENDECTOMY     CHOLECYSTECTOMY     COLONOSCOPY     COLONOSCOPY N/A 05/07/2015   Procedure: COLONOSCOPY;  Surgeon: Daneil Dolin, MD;  Location: [facility]  ENDO SUITE;  Service: Endoscopy;  Laterality: N/A;  0900   ESOPHAGOGASTRODUODENOSCOPY N/A 05/07/2015   Procedure: ESOPHAGOGASTRODUODENOSCOPY (EGD);  Surgeon: Daneil Dolin, MD;  Location: [facility] ENDO SUITE;  Service: Endoscopy;  Laterality: N/A;   HEMORRHOID SURGERY N/A 08/31/2016   Procedure: EXTENSIVE HEMORRHOIDECTOMY;  Surgeon: Aviva Signs, MD;  Location: [facility] ORS;  Service: General;  Laterality: N/A;   LEG SURGERY Right     right ankle fx   VENTRAL HERNIA REPAIR N/A 12/07/2015   Procedure: HERNIA REPAIR VENTRAL ADULT WITH MESH;  Surgeon: Aviva Signs, MD;  Location: [facility] ORS;  Service: General;  Laterality: N/A;   Family History Family History  Problem Relation Age of Onset   Osteoarthritis Mother    Tuberculosis Father    Colon cancer Neg Hx  Limited knowledge of family history    Social History Social History   Tobacco Use   Smoking status: Never   Smokeless tobacco: Never  Vaping Use   Vaping Use: Never used  Substance Use Topics   Alcohol use: No    Alcohol/week: 0.0 standard drinks of alcohol   Drug use: No   Allergies Penicillins  Review of Systems Review of Systems  All other systems reviewed and are negative.   Physical Exam Vital Signs  I have reviewed the triage vital signs BP 125/72   Pulse (!) 47   Temp 98 F (36.7 C)   Resp 13   SpO2 97%  Physical Exam Vitals and nursing note reviewed.  Constitutional:      General: He is not in acute distress.    Appearance: Normal appearance.  HENT:     Mouth/Throat:     Mouth: Mucous membranes are moist.  Eyes:     Conjunctiva/sclera: Conjunctivae normal.  Cardiovascular:     Rate and Rhythm: Normal rate and regular rhythm.  Pulmonary:     Effort: Pulmonary effort is normal. No respiratory distress.     Breath sounds: Normal breath sounds.  Abdominal:     General: Abdomen is flat.     Palpations: Abdomen is soft.     Tenderness: There is abdominal tenderness (mild) in the epigastric area.   Musculoskeletal:     Right lower leg: No edema.     Left lower leg: No edema.  Skin:    General: Skin is warm and dry.     Capillary Refill: Capillary refill takes less than 2 seconds.  Neurological:     Mental Status: He is alert and oriented to person, place, and time. Mental status is at baseline.  Psychiatric:        Mood and Affect: Mood normal.        Behavior: Behavior normal.     ED Results and Treatments Labs (all labs ordered are listed, but only abnormal results are displayed) Labs Reviewed  CBC WITH DIFFERENTIAL/PLATELET  COMPREHENSIVE METABOLIC PANEL  LIPASE, BLOOD  TROPONIN I (HIGH SENSITIVITY)                                                                                                                          Radiology CT Abdomen Pelvis W Contrast  Result Date: 10/23/2021 CLINICAL DATA:  Epigastric pain.  Nausea.  Pain reported for 1 week. EXAM: CT ABDOMEN AND PELVIS WITH CONTRAST TECHNIQUE: Multidetector CT imaging of the abdomen and pelvis was performed using the standard protocol following bolus administration of intravenous contrast. RADIATION DOSE REDUCTION: This exam was performed according to the departmental dose-optimization program which includes automated exposure control, adjustment of the mA and/or kV according to patient size and/or use of iterative reconstruction technique. CONTRAST:  142m OMNIPAQUE IOHEXOL 300 MG/ML  SOLN COMPARISON:  10/03/2003. FINDINGS: Lower chest: Clear lung bases. Hepatobiliary: Liver normal in size. 2 tiny low-attenuation lesions noted in  segment 7, likely cysts. No other liver abnormality. Status post cholecystectomy. No bile duct dilation. Pancreas: Unremarkable. No pancreatic ductal dilatation or surrounding inflammatory changes. Spleen: Normal in size without focal abnormality. Adrenals/Urinary Tract: Adrenal glands are unremarkable. Kidneys are normal, without renal calculi, focal lesion, or hydronephrosis. Bladder is  unremarkable. Stomach/Bowel: Normal stomach. Small bowel and colon are normal in caliber. No wall thickening. No inflammation. Multiple colonic diverticula. Vascular/Lymphatic: No significant vascular findings are present. No enlarged abdominal or pelvic lymph nodes. Reproductive: Enlarged prostate, 6.1 x 4.5 cm transversely. Other: Minimal fat containing umbilical hernia.  No ascites. Musculoskeletal: No fracture or acute finding.  No bone lesion. IMPRESSION: 1. No acute findings within the abdomen or pelvis. No evidence of pancreatitis. No findings to account for abdominal pain. Electronically Signed   By: Lajean Manes M.D.   On: 10/23/2021 10:34    Pertinent labs & imaging results that were available during my care of the patient were reviewed by me and considered in my medical decision making (see MDM for details).  Medications Ordered in ED Medications  alum & mag hydroxide-simeth (MAALOX/MYLANTA) 200-200-20 MG/5ML suspension 30 mL (30 mLs Oral Given 10/23/21 0821)  acetaminophen (TYLENOL) tablet 1,000 mg (1,000 mg Oral Given 10/23/21 0821)  iohexol (OMNIPAQUE) 300 MG/ML solution 100 mL (100 mLs Intravenous Contrast Given 10/23/21 0950)                                                                                                                                     Procedures Procedures  (including critical care time)  Medical Decision Making / ED Course   MDM:  61 year old male presenting with epigastric pain.  Differential includes gastritis, pancreatitis, biliary obstruction, aortic pathology, ACS, perforation.  Will obtain imaging with CT scan.  EKG reassuring without acute ST or T wave changes, appears that inferior T wave changes were present in 2017 EKG.  Will obtain troponin.  Will obtain other laboratory testing.  We will treat symptoms with Maalox and reassess  Clinical Course as of 10/23/21 1057  Sat Oct 23, 2021  1055 Troponin negative.  Lipase negative.  CT scan  negative for acute intra-abdominal process.  Patient reports he has not been taking his omeprazole daily as prescribed.  Encouraged medication compliance.  Suspect gastritis or GERD as the cause of his symptoms.  Symptoms resolved with Maalox. Will discharge patient to home. All questions answered. Patient comfortable with plan of discharge. Return precautions discussed with patient and specified on the after visit summary.  [WS]    Clinical Course User Index [WS] Cristie Hem, MD     Additional history obtained: -External records from outside source obtained and reviewed including: Chart review including previous notes, labs, imaging, consultation notes, prior endoscopy report 2/17   Lab Tests: -I ordered, reviewed, and interpreted labs.   The pertinent results include:   Labs Reviewed  CBC WITH DIFFERENTIAL/PLATELET  COMPREHENSIVE METABOLIC  PANEL  LIPASE, BLOOD  TROPONIN I (HIGH SENSITIVITY)      EKG   EKG Interpretation  Date/Time:  Saturday October 23 2021 07:53:16 EDT Ventricular Rate:  50 PR Interval:  168 QRS Duration: 102 QT Interval:  419 QTC Calculation: 382 R Axis:   10 Text Interpretation: Sinus rhythm Borderline T abnormalities, inferior leads Confirmed by Garnette Gunner [id number]) on 10/23/2021 8:03:50 AM         Imaging Studies ordered: I ordered imaging studies including CT a/P On my interpretation imaging demonstrates no acute process I independently visualized and interpreted imaging. I agree with the radiologist interpretation   Medicines ordered and prescription drug management: Meds ordered this encounter  Medications   alum & mag hydroxide-simeth (MAALOX/MYLANTA) 200-200-20 MG/5ML suspension 30 mL   acetaminophen (TYLENOL) tablet 1,000 mg   iohexol (OMNIPAQUE) 300 MG/ML solution 100 mL    -I have reviewed the patients home medicines and have made adjustments as needed    Cardiac Monitoring: The patient was maintained on a cardiac  monitor.  I personally viewed and interpreted the cardiac monitored which showed an underlying rhythm of: NSR    Reevaluation: After the interventions noted above, I reevaluated the patient and found that they have resolved  Co morbidities that complicate the patient evaluation  Past Medical History:  Diagnosis Date   Anemia    Broken neck ([place]) 2003   Cholecystitis    GERD (gastroesophageal reflux disease)    Rib fractures 2003   Skull fracture ([place]) 2003   Sleep apnea       Dispostion: Disposition decision including need for hospitalization was considered, and patient discharged from emergency department.    Final Clinical Impression(s) / ED Diagnoses Final diagnoses:  Acute gastritis without hemorrhage, unspecified gastritis type     This chart was dictated using voice recognition software.  Despite best efforts to proofread,  errors can occur which can change the documentation meaning.    Cristie Hem, MD 10/23/21 1059

## 2021-10-23 NOTE — ED Triage Notes (Signed)
Pt presents with epigastric pain for 3 days. He describes it as spasms. He has hx of hernia with patch repair 6 years ago. He does endorse having a hx of acid reflux . Not sure if it is either of these, but his pain is worse than before.

## 2021-10-23 NOTE — Discharge Instructions (Signed)
Your symptoms are most likely caused by inflammation in the stomach (gastritis) or reflux.  Please take your Protonix to help resolve your symptoms.  Please take Mylanta as needed for pain. You can buy Mylanta at the pharmacy.

## 2022-03-21 ENCOUNTER — Encounter: Payer: Self-pay | Admitting: *Deleted

## 2023-03-23 ENCOUNTER — Other Ambulatory Visit: Payer: Self-pay

## 2023-03-23 ENCOUNTER — Encounter (HOSPITAL_BASED_OUTPATIENT_CLINIC_OR_DEPARTMENT_OTHER): Payer: Self-pay | Admitting: *Deleted

## 2023-03-23 ENCOUNTER — Emergency Department (HOSPITAL_BASED_OUTPATIENT_CLINIC_OR_DEPARTMENT_OTHER): Admitting: Radiology

## 2023-03-23 ENCOUNTER — Emergency Department (HOSPITAL_BASED_OUTPATIENT_CLINIC_OR_DEPARTMENT_OTHER)
Admission: EM | Admit: 2023-03-23 | Discharge: 2023-03-24 | Disposition: A | Discharge status: Home / Self Care | Attending: Emergency Medicine | Admitting: Emergency Medicine

## 2023-03-23 ENCOUNTER — Emergency Department (HOSPITAL_BASED_OUTPATIENT_CLINIC_OR_DEPARTMENT_OTHER)

## 2023-03-23 DIAGNOSIS — M546 Pain in thoracic spine: Secondary | ICD-10-CM | POA: Diagnosis not present

## 2023-03-23 DIAGNOSIS — R079 Chest pain, unspecified: Secondary | ICD-10-CM

## 2023-03-23 DIAGNOSIS — R0781 Pleurodynia: Secondary | ICD-10-CM | POA: Diagnosis not present

## 2023-03-23 DIAGNOSIS — M549 Dorsalgia, unspecified: Secondary | ICD-10-CM | POA: Diagnosis present

## 2023-03-23 LAB — CBC WITH DIFFERENTIAL/PLATELET
Abs Immature Granulocytes: 0.02 10*3/uL (ref 0.00–0.07)
Basophils Absolute: 0 10*3/uL (ref 0.0–0.1)
Basophils Relative: 0 %
Eosinophils Absolute: 0.2 10*3/uL (ref 0.0–0.5)
Eosinophils Relative: 2 %
HCT: 45.3 % (ref 39.0–52.0)
Hemoglobin: 15.4 g/dL (ref 13.0–17.0)
Immature Granulocytes: 0 %
Lymphocytes Relative: 27 %
Lymphs Abs: 2.5 10*3/uL (ref 0.7–4.0)
MCH: 31.2 pg (ref 26.0–34.0)
MCHC: 34 g/dL (ref 30.0–36.0)
MCV: 91.7 fL (ref 80.0–100.0)
Monocytes Absolute: 0.8 10*3/uL (ref 0.1–1.0)
Monocytes Relative: 8 %
Neutro Abs: 5.6 10*3/uL (ref 1.7–7.7)
Neutrophils Relative %: 63 %
Platelets: 225 10*3/uL (ref 150–400)
RBC: 4.94 MIL/uL (ref 4.22–5.81)
RDW: 12.6 % (ref 11.5–15.5)
WBC: 9.1 10*3/uL (ref 4.0–10.5)
nRBC: 0 % (ref 0.0–0.2)

## 2023-03-23 LAB — COMPREHENSIVE METABOLIC PANEL
ALT: 39 U/L (ref 0–44)
AST: 25 U/L (ref 15–41)
Albumin: 4.8 g/dL (ref 3.5–5.0)
Alkaline Phosphatase: 87 U/L (ref 38–126)
Anion gap: 8 (ref 5–15)
BUN: 15 mg/dL (ref 6–20)
CO2: 28 mmol/L (ref 22–32)
Calcium: 9.5 mg/dL (ref 8.9–10.3)
Chloride: 105 mmol/L (ref 98–111)
Creatinine, Ser: 0.78 mg/dL (ref 0.61–1.24)
GFR, Estimated: >60 mL/min (ref >60)
Glucose, Bld: 88 mg/dL (ref 70–99)
Potassium: 4.1 mmol/L (ref 3.5–5.1)
Sodium: 141 mmol/L (ref 135–145)
Total Bilirubin: 0.8 mg/dL (ref 0.0–1.2)
Total Protein: 7.4 g/dL (ref 6.5–8.1)

## 2023-03-23 LAB — TROPONIN I (HIGH SENSITIVITY)
Troponin I (High Sensitivity): 2 ng/L (ref <18)
Troponin I (High Sensitivity): 3 ng/L (ref <18)

## 2023-03-23 LAB — BRAIN NATRIURETIC PEPTIDE: B Natriuretic Peptide: 39.5 pg/mL (ref 0.0–100.0)

## 2023-03-23 MED ORDER — FENTANYL CITRATE PF 50 MCG/ML IJ SOSY
50.0000 ug | PREFILLED_SYRINGE | Freq: Once | INTRAMUSCULAR | Status: AC
  Administered 2023-03-23: 50 ug via INTRAVENOUS
  Filled 2023-03-23: qty 1

## 2023-03-23 MED ORDER — IOHEXOL 350 MG/ML SOLN
100.0000 mL | Freq: Once | INTRAVENOUS | Status: AC | PRN
  Administered 2023-03-23: 100 mL via INTRAVENOUS

## 2023-03-23 NOTE — ED Notes (Signed)
 Pt refused rib XR, requesting CT.

## 2023-03-23 NOTE — ED Provider Notes (Signed)
 [facility] EMERGENCY DEPARTMENT AT [facility] Provider Note   CSN: [id number] Arrival date & time: 03/23/23  1813     History  Chief Complaint  Patient presents with   Back Pain    Alan Leblanc is a 63 y.o. male.  The history is provided by the patient and medical records. No language interpreter was used.  Back Pain Location:  Thoracic spine Quality:  Aching, cramping and stabbing Radiates to: r chest. Pain severity:  Severe Pain is:  Unable to specify Onset quality:  Gradual Duration:  2 weeks Timing:  Constant Progression:  Worsening Chronicity:  New Context: not recent injury   Relieved by:  Nothing Worsened by:  Deep breathing and movement Ineffective treatments:  None tried Associated symptoms: chest pain (r lateral)   Associated symptoms: no abdominal pain, no dysuria, no headaches, no leg pain, no numbness, no tingling and no weakness        Home Medications Prior to Admission medications   Medication Sig Start Date End Date Taking? Authorizing Provider  CARAFATE 1 GM/10ML suspension Take 4 Doses by mouth as needed.  08/17/16   [provider]  pantoprazole (PROTONIX) 40 MG tablet Take 1 tablet (40 mg total) by mouth daily as needed (for acid reflux). 06/12/17   Anice Paganini, NP      Allergies    Penicillins    Review of Systems   Review of Systems  Constitutional:  Negative for chills and fatigue.  HENT:  Negative for congestion.   Respiratory:  Positive for shortness of breath. Negative for cough, chest tightness and wheezing.   Cardiovascular:  Positive for chest pain (r lateral). Negative for palpitations and leg swelling.  Gastrointestinal:  Negative for abdominal pain, constipation, diarrhea, nausea and vomiting.  Genitourinary:  Negative for dysuria.  Musculoskeletal:  Positive for back pain. Negative for neck pain and neck stiffness.  Skin:  Negative for rash and wound.  Neurological:  Negative for tingling, weakness,  light-headedness, numbness and headaches.  Psychiatric/Behavioral:  Negative for agitation and confusion.   All other systems reviewed and are negative.   Physical Exam Updated Vital Signs BP (!) 151/98   Pulse 66   Temp 98.4 F (36.9 C) (Oral)   Resp 18   SpO2 100%  Physical Exam Vitals and nursing note reviewed.  Constitutional:      General: He is not in acute distress.    Appearance: He is well-developed. He is not ill-appearing, toxic-appearing or diaphoretic.  HENT:     Head: Normocephalic and atraumatic.     Nose: No congestion or rhinorrhea.     Mouth/Throat:     Pharynx: No oropharyngeal exudate or posterior oropharyngeal erythema.  Eyes:     Extraocular Movements: Extraocular movements intact.     Conjunctiva/sclera: Conjunctivae normal.     Pupils: Pupils are equal, round, and reactive to light.  Cardiovascular:     Rate and Rhythm: Normal rate and regular rhythm.     Pulses: Normal pulses.     Heart sounds: No murmur heard. Pulmonary:     Effort: Pulmonary effort is normal. No respiratory distress.     Breath sounds: Normal breath sounds. No wheezing, rhonchi or rales.  Chest:     Chest wall: Tenderness present.  Abdominal:     General: Abdomen is flat.     Palpations: Abdomen is soft.     Tenderness: There is no abdominal tenderness.  Musculoskeletal:        General:  Tenderness present. No swelling.     Cervical back: Neck supple. No tenderness.     Right lower leg: No edema.     Left lower leg: No edema.  Skin:    General: Skin is warm and dry.     Capillary Refill: Capillary refill takes less than 2 seconds.     Findings: No erythema or rash.  Neurological:     General: No focal deficit present.     Mental Status: He is alert.  Psychiatric:        Mood and Affect: Mood normal.     ED Results / Procedures / Treatments   Labs (all labs ordered are listed, but only abnormal results are displayed) Labs Reviewed  CBC WITH DIFFERENTIAL/PLATELET   COMPREHENSIVE METABOLIC PANEL  BRAIN NATRIURETIC PEPTIDE  TROPONIN I (HIGH SENSITIVITY)  TROPONIN I (HIGH SENSITIVITY)    EKG None  Radiology No results found.  Procedures Procedures    Medications Ordered in ED Medications  iohexol (OMNIPAQUE) 350 MG/ML injection 100 mL (100 mLs Intravenous Contrast Given 03/23/23 2157)    ED Course/ Medical Decision Making/ A&P                                 Medical Decision Making Amount and/or Complexity of Data Reviewed Labs: ordered. Radiology: ordered.  Risk Prescription drug management.    Alan Leblanc is a 63 y.o. male with cholecystectomy, previous appendectomy, GERD, previous trauma, and previous hiatal hernia who presents with pleuritic chest discomfort and shortness of breath.  According to patient, for the last 2 weeks has had pain in his right lateral chest, right back that is very pleuritic.  It hurts when he moves.  He denies any trauma.  Denies any congestion cough fevers or chills.  He was told that if it did not get better with some muscle medication after seeing an urgent care several days ago he is to come to the emergency department and may need a CT.  He reports no new skin changes or rash to suggest shingles.  He denies nausea, vomiting, constipation, diarrhea, or urinary changes peer denies new leg pain or leg swelling.  Denies any abdominal pain or flank pains.  Reports the pain is severe at times.  He does say his had some shortness of breath especially with deep breathing or exertion.  On exam, lungs clear.  His right lateral chest is tender as is his right back.  Midline was not tender.  Lungs were surprisingly clear.  No murmur.  Chest anteriorly is nontender.  Good pulses in extremities.  Legs nontender and not critically edematous.  Vital signs did not reveal tachycardia, tachypnea, or hypoxia.  Patient resting but is having the discomfort.  Clinically given his history and recent visit with reassuring  x-ray and now worsening symptoms, it is reasonable to consider getting a CT scan.  His EKG did have an S1Q3T3 pattern.  Given his pleuritic chest discomfort and shortness of breath we will get a CT PE study and will get screening labs.  Suspect this is likely more musculoskeletal however will get imaging and labs first.  If labs are reassuring and imaging is reassuring, anticipate discharge with a different muscle relaxant, possibly pain medicine, and Lidoderm patches and outpatient follow-up.  Anticipate reassessment after workup to determine disposition.  Care transferred to oncoming team to wait for CT results.  Anticipate discharge if imaging reassuring.  Final Clinical Impression(s) / ED Diagnoses Final diagnoses:  Pleuritic pain  Right-sided chest pain  Acute right-sided thoracic back pain   Clinical Impression: 1. Pleuritic pain   2. Right-sided chest pain   3. Acute right-sided thoracic back pain     Disposition: Care transferred to oncoming team to wait for imaging results.  Dissipate discharge if workup reassuring.  This note was prepared with assistance of Conservation officer, historic buildings. Occasional wrong-word or sound-a-like substitutions may have occurred due to the inherent limitations of voice recognition software.      Alan Leblanc, Canary Brim, MD 03/23/23 2352

## 2023-03-23 NOTE — ED Triage Notes (Signed)
 Pt is here for right mid back, rib pain which he has had 2 weeks, pain increases with movement. No sob.  Pt does not recall any injury that could have caused this.  No CP

## 2023-03-24 MED ORDER — LIDOCAINE 5 % EX PTCH: 1.0000 | MEDICATED_PATCH | CUTANEOUS | 0 refills | Status: DC

## 2023-03-24 MED ORDER — METHOCARBAMOL 500 MG PO TABS: 500.0000 mg | ORAL_TABLET | Freq: Two times a day (BID) | ORAL | 0 refills | Status: DC

## 2023-08-25 ENCOUNTER — Ambulatory Visit
Admission: EM | Admit: 2023-08-25 | Discharge: 2023-08-25 | Disposition: A | Discharge status: Home / Self Care | Attending: Family Medicine | Admitting: Family Medicine

## 2023-08-25 DIAGNOSIS — M1711 Unilateral primary osteoarthritis, right knee: Secondary | ICD-10-CM

## 2023-08-25 MED ORDER — PREDNISONE 20 MG PO TABS: ORAL_TABLET | ORAL | 0 refills | Status: DC

## 2023-08-25 NOTE — ED Triage Notes (Signed)
 Pt c/o right knee x couple of years-worse x 1 month-denies injury with initial pain-states he was seen in [place] and was told he has a cyst/no follow up-NAD-limping gait

## 2023-08-25 NOTE — ED Provider Notes (Signed)
 [facility]RG[facility]  Note:  This document was prepared using Conservation officer, historic buildings and may include unintentional dictation errors.  MRN: [id number] DOB: 10-Sep-1960  Subjective:   Alan Leblanc is a 63 y.o. male presenting for 2-year history of persistent chronic right knee pain with acute worsening for the past month.  Reports swelling, difficulty bearing weight and walking.  Does not have an orthopedist.  No fall, trauma.  He was seen at a different clinic and had imaging done.  I was able to retrieve documentation including results for review at today's visit.  No current facility-administered medications for this encounter.  Current Outpatient Medications:    CARAFATE 1 GM/10ML suspension, Take 4 Doses by mouth as needed. , Disp: , Rfl: 0   lidocaine (LIDODERM) 5 %, Place 1 patch onto the skin daily. Remove & Discard patch within 12 hours or as directed by MD, Disp: 30 patch, Rfl: 0   methocarbamol (ROBAXIN) 500 MG tablet, Take 1 tablet (500 mg total) by mouth 2 (two) times daily., Disp: 20 tablet, Rfl: 0   pantoprazole (PROTONIX) 40 MG tablet, Take 1 tablet (40 mg total) by mouth daily as needed (for acid reflux)., Disp: 30 tablet, Rfl: 11   Allergies  Allergen Reactions   Penicillins Other (See Comments)    Has patient had a PCN reaction causing immediate rash, facial/tongue/throat swelling, SOB or lightheadedness with hypotension:unsure Has patient had a PCN reaction causing severe rash involving mucus membranes or skin necrosis:unsure Has patient had a PCN reaction that required hospitalization:unsure Has patient had a PCN reaction occurring within the last 10 years:No If all of the above answers are NO, then may proceed with Cephalosporin use.  Unsure of reaction    Past Medical History:  Diagnosis Date   Anemia    Broken neck (HCC) 2003   Cholecystitis    GERD (gastroesophageal reflux disease)    Rib fractures 2003   Skull fracture (HCC)  2003   Sleep apnea      Past Surgical History:  Procedure Laterality Date   APPENDECTOMY     CHOLECYSTECTOMY     COLONOSCOPY     COLONOSCOPY N/A 05/07/2015   Procedure: COLONOSCOPY;  Surgeon: Lamar CHRISTELLA Hollingshead, MD;  [facility]: [facility];  Service: Endoscopy;  Laterality: N/A;  0900   ESOPHAGOGASTRODUODENOSCOPY N/A 05/07/2015   Procedure: ESOPHAGOGASTRODUODENOSCOPY (EGD);  Surgeon: Lamar CHRISTELLA Hollingshead, MD;  Location: [facility];  Service: Endoscopy;  Laterality: N/A;   HEMORRHOID SURGERY N/A 08/31/2016   Procedure: EXTENSIVE HEMORRHOIDECTOMY;  Surgeon: Mavis Anes, MD;  Location: [facility];  Service: General;  Laterality: N/A;   LEG SURGERY Right     right ankle fx   VENTRAL HERNIA REPAIR N/A 12/07/2015   Procedure: HERNIA REPAIR VENTRAL ADULT WITH MESH;  Surgeon: Anes Mavis, MD;  Location: [facility];  Service: General;  Laterality: N/A;    Family History  Problem Relation Age of Onset   Osteoarthritis Mother    Tuberculosis Father    Colon cancer Neg Hx        Limited knowledge of family history    Social History   Tobacco Use   Smoking status: Never   Smokeless tobacco: Never  Vaping Use   Vaping status: Never Used  Substance Use Topics   Alcohol use: Not Currently    Comment: occ   Drug use: No    ROS   Objective:   Vitals: BP 130/86 (BP Location: Right Arm)  Pulse (!) 56   Temp 98.7 F (37.1 C) (Oral)   Resp 20   SpO2 95%   Physical Exam Constitutional:      General: He is not in acute distress.    Appearance: Normal appearance. He is well-developed and normal weight. He is not ill-appearing, toxic-appearing or diaphoretic.  HENT:     Head: Normocephalic and atraumatic.     Right Ear: External ear normal.     Left Ear: External ear normal.     Nose: Nose normal.     Mouth/Throat:     Pharynx: Oropharynx is clear.  Eyes:     General: No scleral icterus.       Right eye: No discharge.        Left eye: No discharge.     Extraocular Movements:  Extraocular movements intact.  Cardiovascular:     Rate and Rhythm: Normal rate.  Pulmonary:     Effort: Pulmonary effort is normal.  Musculoskeletal:     Cervical back: Normal range of motion.     Right knee: Swelling and bony tenderness present. No deformity, effusion, erythema, ecchymosis, lacerations or crepitus. Decreased range of motion. Tenderness present over the medial joint line and patellar tendon. No lateral joint line tenderness. Normal alignment and normal patellar mobility.  Neurological:     Mental Status: He is alert and oriented to person, place, and time.  Psychiatric:        Mood and Affect: Mood normal.        Behavior: Behavior normal.        Thought Content: Thought content normal.        Judgment: Judgment normal.    X-ray knee right 4 or more views  Anatomical Region Laterality Modality  Thigh right Radiographic Imaging  Knee -- --  Leg -- --   Narrative  This result has an attachment that is not available. 4 views of the right knee were performed reviewed by myself today.  They show tricompartment arthritis with medial, patellofemoral joint space narrowing and mild varus overall alignment.  There appears to be a lucency in his metaphysis of his tibia which could be consistent with a cyst. Exam End: 11/08/22 16:07 Last Resulted: 11/08/22 16:30  Received From: [facility]  Result Received: 03/23/23 18:13    Applied a 4 inch Ace wrap to the right knee.  Assessment and Plan :   PDMP not reviewed this encounter.  1. Arthritis of right knee    Patient has tricompartmental arthritis of the right knee.  Also has findings suggestive of a cyst.  Emphasized need for further follow-up with an orthopedist, consideration for more workup, imaging including an MRI and or ultrasound.  For now, recommend RICE method, prednisone course for an acute flare of his chronic knee pain.  Counseled patient on potential for adverse effects with medications  prescribed/recommended today, ER and return-to-clinic precautions discussed, patient verbalized understanding.    Christopher Savannah, PA-C 08/25/23 1311

## 2023-12-25 NOTE — Progress Notes (Signed)
 Surgery orders requested via Epic inbox.

## 2023-12-28 NOTE — Progress Notes (Signed)
 Sent message, via epic in basket, requesting orders in epic from Careers adviser.

## 2023-12-28 NOTE — Progress Notes (Signed)
Second request for pre op orders in [facility]: Spoke with Sherry Gavin  

## 2023-12-29 NOTE — Progress Notes (Signed)
 Date of COVID positive in last 90 days:  PCP - Robbi Brinks, PA Cardiologist -   Chest x-ray - N/A EKG - 03/28/23 Epic Stress Test - N/A ECHO - N/A Cardiac Cath - N/A Pacemaker/ICD device last checked:N/A Spinal Cord Stimulator:N/A  Bowel Prep - N/A  Sleep Study -  CPAP -   Fasting Blood Sugar - N/A Checks Blood Sugar _____ times a day  Last dose of GLP1 agonist-  N/A GLP1 instructions:  Do not take after     Last dose of SGLT-2 inhibitors-  N/A SGLT-2 instructions:  Do not take after     Blood Thinner Instructions: N/A Last dose:   Time: Aspirin Instructions:N/A Last Dose:  Activity level:  Can go up a flight of stairs and perform activities of daily living without stopping and without symptoms of chest pain or shortness of breath.  Able to exercise without symptoms  Unable to go up a flight of stairs without symptoms of     Anesthesia review: N/A  Patient denies shortness of breath, fever, cough and chest pain at PAT appointment  Patient verbalized understanding of instructions that were given to them at the PAT appointment. Patient was also instructed that they will need to review over the PAT instructions again at home before surgery.

## 2023-12-29 NOTE — Patient Instructions (Signed)
 SURGICAL WAITING ROOM VISITATION  Patients having surgery or a procedure may have no more than 2 support people in the waiting area - these visitors may rotate.    Children ages 20 and under will not be able to visit patients in [facility] under most circumstances.   Visitors with respiratory illnesses are discouraged from visiting and should remain at home.  If the patient needs to stay at the hospital during part of their recovery, the visitor guidelines for inpatient rooms apply. Pre-op nurse will coordinate an appropriate time for 1 support person to accompany patient in pre-op.  This support person may not rotate.    Please refer to the [facility] website for the visitor guidelines for Inpatients (after your surgery is over and you are in a regular room).    Your procedure is scheduled on: 01/08/24   Report to [facility] Main Entrance    Report to admitting at 5:15 AM   Call this number if you have problems the morning of surgery [phone]   Do not eat food :After Midnight.   After Midnight you may have the following liquids until 4:15 AM DAY OF SURGERY  Water  Non-Citrus Juices (without pulp, NO RED-Apple, White grape, White cranberry) Black Coffee (NO MILK/CREAM OR CREAMERS, sugar ok)  Clear Tea (NO MILK/CREAM OR CREAMERS, sugar ok) regular and decaf                             Plain Jell-O (NO RED)                                           Fruit ices (not with fruit pulp, NO RED)                                     Popsicles (NO RED)                                                               Sports drinks like Gatorade (NO RED)              Drink 2 Ensure/G2 drinks AT 10:00 PM the night before surgery.        The day of surgery:  Drink ONE (1) Pre-Surgery Clear Ensure or G2 at AM the morning of surgery. Drink in one sitting. Do not sip.  This drink was given to you during your hospital  pre-op appointment visit. Nothing else to drink after  completing the  Pre-Surgery Clear Ensure or G2.          If you have questions, please contact your surgeons office.   FOLLOW BOWEL PREP AND ANY ADDITIONAL PRE OP INSTRUCTIONS YOU RECEIVED FROM YOUR SURGEON'S OFFICE!!!     Oral Hygiene is also important to reduce your risk of infection.                                    Remember - BRUSH YOUR TEETH THE MORNING  OF SURGERY WITH YOUR REGULAR TOOTHPASTE  DENTURES WILL BE REMOVED PRIOR TO SURGERY PLEASE DO NOT APPLY Poly grip OR ADHESIVES!!!   Do NOT smoke after Midnight   Stop all vitamins and herbal supplements 7 days before surgery.   Take these medicines the morning of surgery with A SIP OF WATER :   DO NOT TAKE ANY ORAL DIABETIC MEDICATIONS DAY OF YOUR SURGERY  Bring CPAP mask and tubing day of surgery.                              You may not have any metal on your body including jewelry, and body piercing             Do not wear lotions, powders, cologne, or deodorant              Men may shave face and neck.   Do not bring valuables to the hospital. [facility] IS NOT             RESPONSIBLE   FOR VALUABLES.   Contacts, glasses, dentures or bridgework may not be worn into surgery.  DO NOT BRING YOUR HOME MEDICATIONS TO THE HOSPITAL. PHARMACY WILL DISPENSE MEDICATIONS LISTED ON YOUR MEDICATION LIST TO YOU DURING YOUR ADMISSION IN THE HOSPITAL!    Patients discharged on the day of surgery will not be allowed to drive home.  Someone NEEDS to stay with you for the first 24 hours after anesthesia.   Special Instructions: Bring a copy of your healthcare power of attorney and living will documents the day of surgery if you haven't scanned them before.              Please read over the following fact sheets you were given: IF YOU HAVE QUESTIONS ABOUT YOUR PRE-OP INSTRUCTIONS PLEASE CALL [phone].   If you received a COVID test during your pre-op visit  it is requested that you wear a mask when out in public, stay  away from anyone that may not be feeling well and notify your surgeon if you develop symptoms. If you test positive for Covid or have been in contact with anyone that has tested positive in the last 10 days please notify you surgeon.      Pre-operative 4 CHG Bath Instructions  DYNA-Hex 4 Chlorhexidine  Gluconate 4% Solution Antiseptic 4 fl. oz   You can play a key role in reducing the risk of infection after surgery. Your skin needs to be as free of germs as possible. You can reduce the number of germs on your skin by washing with CHG (chlorhexidine  gluconate) soap before surgery. CHG is an antiseptic soap that kills germs and continues to kill germs even after washing.   DO NOT use if you have an allergy to chlorhexidine /CHG or antibacterial soaps. If your skin becomes reddened or irritated, stop using the CHG and notify one of our RNs at   Please shower with the CHG soap starting 4 days before surgery using the following schedule:     Please keep in mind the following:  DO NOT shave, including legs and underarms, starting the day of your first shower.   You may shave your face at any point before/day of surgery.  Place clean sheets on your bed the day you start using CHG soap. Use a clean washcloth (not used since being washed) for each shower. DO NOT sleep with pets once you start using the CHG.  CHG Shower Instructions:  If you choose to wash your hair and private area, wash first with your normal shampoo/soap.  After you use shampoo/soap, rinse your hair and body thoroughly to remove shampoo/soap residue.  Turn the water  OFF and apply about 3 tablespoons (45 ml) of CHG soap to a CLEAN washcloth.  Apply CHG soap ONLY FROM YOUR NECK DOWN TO YOUR TOES (washing for 3-5 minutes)  DO NOT use CHG soap on face, private areas, open wounds, or sores.  Pay special attention to the area where your surgery is being performed.  If you are having back surgery, having someone wash your back for you  may be helpful. Wait 2 minutes after CHG soap is applied, then you may rinse off the CHG soap.  Pat dry with a clean towel  Put on clean clothes/pajamas   If you choose to wear lotion, please use ONLY the CHG-compatible lotions on the back of this paper.     Additional instructions for the day of surgery: DO NOT APPLY any lotions, deodorants, cologne, or perfumes.   Put on clean/comfortable clothes.  Brush your teeth.  Ask your nurse before applying any prescription medications to the skin.   CHG Compatible Lotions   Aveeno Moisturizing lotion  Cetaphil Moisturizing Cream  Cetaphil Moisturizing Lotion  Clairol Herbal Essence Moisturizing Lotion, Dry Skin  Clairol Herbal Essence Moisturizing Lotion, Extra Dry Skin  Clairol Herbal Essence Moisturizing Lotion, Normal Skin  Curel Age Defying Therapeutic Moisturizing Lotion with Alpha Hydroxy  Curel Extreme Care Body Lotion  Curel Soothing Hands Moisturizing Hand Lotion  Curel Therapeutic Moisturizing Cream, Fragrance-Free  Curel Therapeutic Moisturizing Lotion, Fragrance-Free  Curel Therapeutic Moisturizing Lotion, Original Formula  Eucerin Daily Replenishing Lotion  Eucerin Dry Skin Therapy Plus Alpha Hydroxy Crme  Eucerin Dry Skin Therapy Plus Alpha Hydroxy Lotion  Eucerin Original Crme  Eucerin Original Lotion  Eucerin Plus Crme Eucerin Plus Lotion  Eucerin TriLipid Replenishing Lotion  Keri Anti-Bacterial Hand Lotion  Keri Deep Conditioning Original Lotion Dry Skin Formula Softly Scented  Keri Deep Conditioning Original Lotion, Fragrance Free Sensitive Skin Formula  Keri Lotion Fast Absorbing Fragrance Free Sensitive Skin Formula  Keri Lotion Fast Absorbing Softly Scented Dry Skin Formula  Keri Original Lotion  Keri Skin Renewal Lotion Keri Silky Smooth Lotion  Keri Silky Smooth Sensitive Skin Lotion  Nivea Body Creamy Conditioning Oil  Nivea Body Extra Enriched Teacher, Adult Education  Moisturizing Lotion Nivea Crme  Nivea Skin Firming Lotion  NutraDerm 30 Skin Lotion  NutraDerm Skin Lotion  NutraDerm Therapeutic Skin Cream  NutraDerm Therapeutic Skin Lotion  ProShield Protective Hand Cream  Provon moisturizing lotion  View Pre-Surgery Education Videos:  [email or web address]

## 2023-12-29 NOTE — Progress Notes (Signed)
 Please place orders for PAT appointment scheduled 01/01/24.

## 2024-01-01 ENCOUNTER — Encounter (HOSPITAL_COMMUNITY): Payer: Self-pay

## 2024-01-01 ENCOUNTER — Other Ambulatory Visit: Payer: Self-pay

## 2024-01-01 ENCOUNTER — Encounter (HOSPITAL_COMMUNITY)
Admission: RE | Admit: 2024-01-01 | Discharge: 2024-01-01 | Disposition: A | Discharge status: Home / Self Care | Source: Ambulatory Visit | Attending: Orthopedic Surgery | Admitting: Orthopedic Surgery

## 2024-01-01 ENCOUNTER — Ambulatory Visit: Payer: Self-pay | Admitting: Emergency Medicine

## 2024-01-01 VITALS — BP 110/62 | HR 49 | Temp 97.9°F | Resp 16 | Ht 66.0 in | Wt 191.2 lb

## 2024-01-01 DIAGNOSIS — Z01812 Encounter for preprocedural laboratory examination: Secondary | ICD-10-CM | POA: Diagnosis not present

## 2024-01-01 DIAGNOSIS — Z01818 Encounter for other preprocedural examination: Secondary | ICD-10-CM | POA: Diagnosis present

## 2024-01-01 DIAGNOSIS — M25561 Pain in right knee: Secondary | ICD-10-CM | POA: Diagnosis not present

## 2024-01-01 DIAGNOSIS — G8929 Other chronic pain: Secondary | ICD-10-CM | POA: Diagnosis not present

## 2024-01-01 HISTORY — DX: Osteoarthritis of knee, unspecified: M17.9

## 2024-01-01 HISTORY — DX: Osteomyelitis, unspecified: M86.9

## 2024-01-01 HISTORY — DX: Fatty (change of) liver, not elsewhere classified: K76.0

## 2024-01-01 HISTORY — DX: Personal history of other diseases of the nervous system and sense organs: Z86.69

## 2024-01-01 LAB — COMPREHENSIVE METABOLIC PANEL WITH GFR
ALT: 36 U/L (ref 0–44)
AST: 31 U/L (ref 15–41)
Albumin: 4.2 g/dL (ref 3.5–5.0)
Alkaline Phosphatase: 98 U/L (ref 38–126)
Anion gap: 10 (ref 5–15)
BUN: 15 mg/dL (ref 8–23)
CO2: 24 mmol/L (ref 22–32)
Calcium: 9.7 mg/dL (ref 8.9–10.3)
Chloride: 103 mmol/L (ref 98–111)
Creatinine, Ser: 0.75 mg/dL (ref 0.61–1.24)
GFR, Estimated: >60 mL/min
Glucose, Bld: 111 mg/dL — ABNORMAL HIGH (ref 70–99)
Potassium: 4.9 mmol/L (ref 3.5–5.1)
Sodium: 137 mmol/L (ref 135–145)
Total Bilirubin: 0.6 mg/dL (ref 0.0–1.2)
Total Protein: 7.8 g/dL (ref 6.5–8.1)

## 2024-01-01 LAB — CBC WITH DIFFERENTIAL/PLATELET
Abs Immature Granulocytes: 0.04 K/uL (ref 0.00–0.07)
Basophils Absolute: 0.1 K/uL (ref 0.0–0.1)
Basophils Relative: 1 %
Eosinophils Absolute: 0.2 K/uL (ref 0.0–0.5)
Eosinophils Relative: 3 %
HCT: 46.4 % (ref 39.0–52.0)
Hemoglobin: 15.6 g/dL (ref 13.0–17.0)
Immature Granulocytes: 1 %
Lymphocytes Relative: 30 %
Lymphs Abs: 2.2 K/uL (ref 0.7–4.0)
MCH: 31.5 pg (ref 26.0–34.0)
MCHC: 33.6 g/dL (ref 30.0–36.0)
MCV: 93.7 fL (ref 80.0–100.0)
Monocytes Absolute: 0.5 K/uL (ref 0.1–1.0)
Monocytes Relative: 7 %
Neutro Abs: 4.4 K/uL (ref 1.7–7.7)
Neutrophils Relative %: 58 %
Platelets: 235 K/uL (ref 150–400)
RBC: 4.95 MIL/uL (ref 4.22–5.81)
RDW: 12.7 % (ref 11.5–15.5)
WBC: 7.5 K/uL (ref 4.0–10.5)
nRBC: 0 % (ref 0.0–0.2)

## 2024-01-01 LAB — SURGICAL PCR SCREEN
MRSA, PCR: NEGATIVE
Staphylococcus aureus: NEGATIVE

## 2024-01-01 NOTE — H&P (Signed)
 TOTAL KNEE ADMISSION H&P  Patient is being admitted for right total knee arthroplasty.  Subjective:  Chief Complaint:right knee pain.  HPI: Alan Leblanc, 63 y.o. male, has a history of pain and functional disability in the right knee due to arthritis and has failed non-surgical conservative treatments for greater than 12 weeks to includeNSAID's and/or analgesics, corticosteriod injections, use of assistive devices, and activity modification.  Onset of symptoms was gradual, starting many years ago with gradually worsening course since that time. The patient noted no past surgery on the right knee(s).  Patient currently rates pain in the right knee(s) at 10 out of 10 with activity. Patient has night pain, worsening of pain with activity and weight bearing, pain that interferes with activities of daily living, and pain with passive range of motion.  Patient has evidence of periarticular osteophytes and joint space narrowing by imaging studies.   There is no active infection.  Patient Active Problem List   Diagnosis Date Noted   Grade IV hemorrhoids    Constipation 09/22/2015   Reflux esophagitis    Hiatal hernia    History of colonic polyps    First degree hemorrhoids    Hematochezia 04/15/2015   GERD (gastroesophageal reflux disease) 04/15/2015   Anemia 04/15/2015   HEMORRHAGE OF RECTUM AND ANUS 10/16/2007   ABDOMINAL PAIN, EPIGASTRIC 10/16/2007   Past Medical History:  Diagnosis Date   Anemia    Broken neck (HCC) 2003   Cholecystitis    GERD (gastroesophageal reflux disease)    Rib fractures 2003   Skull fracture (HCC) 2003   Sleep apnea     Past Surgical History:  Procedure Laterality Date   APPENDECTOMY     CHOLECYSTECTOMY     COLONOSCOPY     COLONOSCOPY N/A 05/07/2015   Procedure: COLONOSCOPY;  Surgeon: Lamar CHRISTELLA Hollingshead, MD;  [facility];  Service: Endoscopy;  Laterality: N/A;  0900   ESOPHAGOGASTRODUODENOSCOPY N/A 05/07/2015   Procedure:  ESOPHAGOGASTRODUODENOSCOPY (EGD);  Surgeon: Lamar CHRISTELLA Hollingshead, MD;  [facility];  Service: Endoscopy;  Laterality: N/A;   HEMORRHOID SURGERY N/A 08/31/2016   Procedure: EXTENSIVE HEMORRHOIDECTOMY;  Surgeon: Mavis Anes, MD;  Location: [facility];  Service: General;  Laterality: N/A;   LEG SURGERY Right     right ankle fx   VENTRAL HERNIA REPAIR N/A 12/07/2015   Procedure: HERNIA REPAIR VENTRAL ADULT WITH MESH;  Surgeon: Anes Mavis, MD;  Location: [facility];  Service: General;  Laterality: N/A;    Current Outpatient Medications  Medication Sig Dispense Refill Last Dose/Taking   famotidine  (PEPCID ) 20 MG tablet Take 20 mg by mouth daily as needed for heartburn or indigestion.      MAGNESIUM CHLORIDE-CALCIUM PO Take 1 tablet by mouth daily.      No current facility-administered medications for this visit.   Allergies[1]  Social History   Tobacco Use   Smoking status: Never   Smokeless tobacco: Never  Substance Use Topics   Alcohol  use: Not Currently    Comment: occ    Family History  Problem Relation Age of Onset   Osteoarthritis Mother    Tuberculosis Father    Colon cancer Neg Hx        Limited knowledge of family history     Review of Systems  Musculoskeletal:  Positive for arthralgias.  All other systems reviewed and are negative.   Objective:  Physical Exam Constitutional:      General: He is not in acute distress.  Appearance: Normal appearance. He is normal weight.  HENT:     Head: Normocephalic and atraumatic.  Eyes:     Extraocular Movements: Extraocular movements intact.     Conjunctiva/sclera: Conjunctivae normal.     Pupils: Pupils are equal, round, and reactive to light.  Cardiovascular:     Rate and Rhythm: Normal rate and regular rhythm.     Pulses: Normal pulses.     Heart sounds: Normal heart sounds.  Pulmonary:     Effort: Pulmonary effort is normal. No respiratory distress.  Abdominal:     General: There is no distension.      Palpations: Abdomen is soft.  Musculoskeletal:        General: Tenderness present.     Cervical back: Normal range of motion and neck supple.     Comments: TTP over medial and lateral joint line, medial worse than lateral.  No calf tenderness, swelling, or erythema.  No overlying lesions of area of chief complaint.  Decreased strength and ROM due to elicited pain.  Dorsiflexion and plantarflexion intact.  Stable to varus and valgus stress.  BLE appear grossly neurovascularly intact.  Gait mildly antalgic.   Skin:    General: Skin is warm and dry.     Capillary Refill: Capillary refill takes less than 2 seconds.     Findings: No erythema or rash.  Neurological:     General: No focal deficit present.     Mental Status: He is alert and oriented to person, place, and time.  Psychiatric:        Mood and Affect: Mood normal.        Behavior: Behavior normal.     Vital signs in last 24 hours: @VSRANGES @  Labs:   Estimated body mass index is 27.44 kg/m as calculated from the following:   Height as of 05/03/19: 5' 6 (1.676 m).   Weight as of 05/03/19: 77.1 kg.   Imaging Review Plain radiographs demonstrate severe degenerative joint disease of the right knee(s).  The bone quality appears to be fair for age and reported activity level.      Assessment/Plan:  End stage arthritis, right knee   The patient history, physical examination, clinical judgment of the provider and imaging studies are consistent with end stage degenerative joint disease of the right knee(s) and total knee arthroplasty is deemed medically necessary. The treatment options including medical management, injection therapy arthroscopy and arthroplasty were discussed at length. The risks and benefits of total knee arthroplasty were presented and reviewed. The risks due to aseptic loosening, infection, stiffness, patella tracking problems, thromboembolic complications and other imponderables were discussed. The patient  acknowledged the explanation, agreed to proceed with the plan and consent was signed. Patient is being admitted for inpatient treatment for surgery, pain control, PT, OT, prophylactic antibiotics, VTE prophylaxis, progressive ambulation and ADL's and discharge planning. The patient is planning to be discharged home with OPPT     Patient's anticipated LOS is less than 2 midnights, meeting these requirements: - Younger than 61 - Lives within 1 hour of care - Has a competent adult at home to recover with post-op recover - NO history of  - Chronic pain requiring opiods  - Diabetes  - Coronary Artery Disease  - Heart failure  - Heart attack  - Stroke  - DVT/VTE  - Cardiac arrhythmia  - Respiratory Failure/COPD  - Renal failure  - Anemia  - Advanced Liver disease      [1]  Allergies Allergen  Reactions   Penicillins Other (See Comments)    Unsure of reaction

## 2024-01-01 NOTE — Progress Notes (Signed)
 PCP - Robbi Brinks, PA Cardiologist - N/A   Chest x-ray - N/A EKG - 03/28/23 Epic Stress Test - N/A ECHO - N/A Cardiac Cath - N/A Pacemaker/ICD device last checked:N/A Spinal Cord Stimulator:N/A   Bowel Prep - N/A   Sleep Study - Yes  CPAP - lost weight no CPAP   Fasting Blood Sugar - N/A Checks Blood Sugar _____ times a day   Last dose of GLP1 agonist-  N/A GLP1 instructions:  Do not take after     Last dose of SGLT-2 inhibitors-  N/A SGLT-2 instructions:  Do not take after       Blood Thinner Instructions: N/A Last dose:                Time: Aspirin Instructions:N/A Last Dose:   Activity level:   Able to exercise without symptoms other than knee pain   Anesthesia review: N/A   Patient denies shortness of breath, fever, cough and chest pain at PAT appointment   Patient verbalized understanding of instructions that were given to them at the PAT appointment. Patient was also instructed that they will need to review over the PAT instructions again at home before surgery.

## 2024-01-01 NOTE — H&P (View-Only) (Signed)
 TOTAL KNEE ADMISSION H&P  Patient is being admitted for right total knee arthroplasty.  Subjective:  Chief Complaint:right knee pain.  HPI: Alan Leblanc, 63 y.o. male, has a history of pain and functional disability in the right knee due to arthritis and has failed non-surgical conservative treatments for greater than 12 weeks to includeNSAID's and/or analgesics, corticosteriod injections, use of assistive devices, and activity modification.  Onset of symptoms was gradual, starting many years ago with gradually worsening course since that time. The patient noted no past surgery on the right knee(s).  Patient currently rates pain in the right knee(s) at 10 out of 10 with activity. Patient has night pain, worsening of pain with activity and weight bearing, pain that interferes with activities of daily living, and pain with passive range of motion.  Patient has evidence of periarticular osteophytes and joint space narrowing by imaging studies.   There is no active infection.  Patient Active Problem List   Diagnosis Date Noted   Grade IV hemorrhoids    Constipation 09/22/2015   Reflux esophagitis    Hiatal hernia    History of colonic polyps    First degree hemorrhoids    Hematochezia 04/15/2015   GERD (gastroesophageal reflux disease) 04/15/2015   Anemia 04/15/2015   HEMORRHAGE OF RECTUM AND ANUS 10/16/2007   ABDOMINAL PAIN, EPIGASTRIC 10/16/2007   Past Medical History:  Diagnosis Date   Anemia    Broken neck (HCC) 2003   Cholecystitis    GERD (gastroesophageal reflux disease)    Rib fractures 2003   Skull fracture (HCC) 2003   Sleep apnea     Past Surgical History:  Procedure Laterality Date   APPENDECTOMY     CHOLECYSTECTOMY     COLONOSCOPY     COLONOSCOPY N/A 05/07/2015   Procedure: COLONOSCOPY;  Surgeon: Lamar CHRISTELLA Hollingshead, MD;  Location: [facility];  Service: Endoscopy;  Laterality: N/A;  0900   ESOPHAGOGASTRODUODENOSCOPY N/A 05/07/2015   Procedure:  ESOPHAGOGASTRODUODENOSCOPY (EGD);  Surgeon: Lamar CHRISTELLA Hollingshead, MD;  Location: [facility];  Service: Endoscopy;  Laterality: N/A;   HEMORRHOID SURGERY N/A 08/31/2016   Procedure: EXTENSIVE HEMORRHOIDECTOMY;  Surgeon: Mavis Anes, MD;  Location: [facility];  Service: General;  Laterality: N/A;   LEG SURGERY Right     right ankle fx   VENTRAL HERNIA REPAIR N/A 12/07/2015   Procedure: HERNIA REPAIR VENTRAL ADULT WITH MESH;  Surgeon: Anes Mavis, MD;  Location: [facility];  Service: General;  Laterality: N/A;    Current Outpatient Medications  Medication Sig Dispense Refill Last Dose/Taking   famotidine  (PEPCID ) 20 MG tablet Take 20 mg by mouth daily as needed for heartburn or indigestion.      MAGNESIUM CHLORIDE-CALCIUM PO Take 1 tablet by mouth daily.      No current facility-administered medications for this visit.   Allergies[1]  Social History   Tobacco Use   Smoking status: Never   Smokeless tobacco: Never  Substance Use Topics   Alcohol  use: Not Currently    Comment: occ    Family History  Problem Relation Age of Onset   Osteoarthritis Mother    Tuberculosis Father    Colon cancer Neg Hx        Limited knowledge of family history     Review of Systems  Musculoskeletal:  Positive for arthralgias.  All other systems reviewed and are negative.   Objective:  Physical Exam Constitutional:      General: He is not in acute distress.  Appearance: Normal appearance. He is normal weight.  HENT:     Head: Normocephalic and atraumatic.  Eyes:     Extraocular Movements: Extraocular movements intact.     Conjunctiva/sclera: Conjunctivae normal.     Pupils: Pupils are equal, round, and reactive to light.  Cardiovascular:     Rate and Rhythm: Normal rate and regular rhythm.     Pulses: Normal pulses.     Heart sounds: Normal heart sounds.  Pulmonary:     Effort: Pulmonary effort is normal. No respiratory distress.  Abdominal:     General: There is no distension.      Palpations: Abdomen is soft.  Musculoskeletal:        General: Tenderness present.     Cervical back: Normal range of motion and neck supple.     Comments: TTP over medial and lateral joint line, medial worse than lateral.  No calf tenderness, swelling, or erythema.  No overlying lesions of area of chief complaint.  Decreased strength and ROM due to elicited pain.  Dorsiflexion and plantarflexion intact.  Stable to varus and valgus stress.  BLE appear grossly neurovascularly intact.  Gait mildly antalgic.   Skin:    General: Skin is warm and dry.     Capillary Refill: Capillary refill takes less than 2 seconds.     Findings: No erythema or rash.  Neurological:     General: No focal deficit present.     Mental Status: He is alert and oriented to person, place, and time.  Psychiatric:        Mood and Affect: Mood normal.        Behavior: Behavior normal.     Vital signs in last 24 hours: @VSRANGES @  Labs:   Estimated body mass index is 27.44 kg/m as calculated from the following:   Height as of 05/03/19: 5' 6 (1.676 m).   Weight as of 05/03/19: 77.1 kg.   Imaging Review Plain radiographs demonstrate severe degenerative joint disease of the right knee(s).  The bone quality appears to be fair for age and reported activity level.      Assessment/Plan:  End stage arthritis, right knee   The patient history, physical examination, clinical judgment of the provider and imaging studies are consistent with end stage degenerative joint disease of the right knee(s) and total knee arthroplasty is deemed medically necessary. The treatment options including medical management, injection therapy arthroscopy and arthroplasty were discussed at length. The risks and benefits of total knee arthroplasty were presented and reviewed. The risks due to aseptic loosening, infection, stiffness, patella tracking problems, thromboembolic complications and other imponderables were discussed. The patient  acknowledged the explanation, agreed to proceed with the plan and consent was signed. Patient is being admitted for inpatient treatment for surgery, pain control, PT, OT, prophylactic antibiotics, VTE prophylaxis, progressive ambulation and ADL's and discharge planning. The patient is planning to be discharged home with OPPT     Patient's anticipated LOS is less than 2 midnights, meeting these requirements: - Younger than 61 - Lives within 1 hour of care - Has a competent adult at home to recover with post-op recover - NO history of  - Chronic pain requiring opiods  - Diabetes  - Coronary Artery Disease  - Heart failure  - Heart attack  - Stroke  - DVT/VTE  - Cardiac arrhythmia  - Respiratory Failure/COPD  - Renal failure  - Anemia  - Advanced Liver disease      [1]  Allergies Allergen  Reactions   Penicillins Other (See Comments)    Unsure of reaction

## 2024-01-07 NOTE — Anesthesia Preprocedure Evaluation (Signed)
"                                    Anesthesia Evaluation  Patient identified by MRN, date of birth, ID band Patient awake    Reviewed: Allergy & Precautions, NPO status , Patient's Chart, lab work & pertinent test results  History of Anesthesia Complications Negative for: history of anesthetic complications  Airway Mallampati: IV  TM Distance: >3 FB Neck ROM: Full    Dental  (+) Dental Advisory Given   Pulmonary sleep apnea (no linger requires CPAP)    breath sounds clear to auscultation       Cardiovascular negative cardio ROS  Rhythm:Regular Rate:Normal     Neuro/Psych negative neurological ROS     GI/Hepatic Neg liver ROS, hiatal hernia,GERD  Medicated and Controlled,,  Endo/Other  BMI 31  Renal/GU negative Renal ROS     Musculoskeletal  (+) Arthritis ,    Abdominal   Peds  Hematology 15.6, plt 235k   Anesthesia Other Findings   Reproductive/Obstetrics                              Anesthesia Physical Anesthesia Plan  ASA: 2  Anesthesia Plan: General   Post-op Pain Management: Regional block* and Tylenol  PO (pre-op)*   Induction:   PONV Risk Score and Plan: 2 and Treatment may vary due to age or medical condition  Airway Management Planned: Oral ETT and Video Laryngoscope Planned  Additional Equipment: None  Intra-op Plan:   Post-operative Plan: Extubation in OR  Informed Consent: I have reviewed the patients History and Physical, chart, labs and discussed the procedure including the risks, benefits and alternatives for the proposed anesthesia with the patient or authorized representative who has indicated his/her understanding and acceptance.     Dental advisory given  Plan Discussed with: CRNA and Surgeon  Anesthesia Plan Comments: (Plan routine monitors GETA with adductor canal block for post op analgesia, pt refuses SAB)         Anesthesia Quick Evaluation  "

## 2024-01-08 ENCOUNTER — Encounter (HOSPITAL_COMMUNITY): Payer: Self-pay | Admitting: Orthopedic Surgery

## 2024-01-08 ENCOUNTER — Observation Stay (HOSPITAL_COMMUNITY)
Admission: RE | Admit: 2024-01-08 | Discharge: 2024-01-09 | Disposition: A | Discharge status: Home / Self Care | Attending: Orthopedic Surgery | Admitting: Orthopedic Surgery

## 2024-01-08 ENCOUNTER — Ambulatory Visit (HOSPITAL_COMMUNITY)

## 2024-01-08 ENCOUNTER — Encounter (HOSPITAL_COMMUNITY)
Admission: RE | Disposition: A | Discharge status: Home / Self Care | Payer: Self-pay | Source: Home / Self Care | Attending: Orthopedic Surgery

## 2024-01-08 ENCOUNTER — Other Ambulatory Visit: Payer: Self-pay

## 2024-01-08 ENCOUNTER — Ambulatory Visit (HOSPITAL_COMMUNITY): Payer: Self-pay | Admitting: Medical

## 2024-01-08 ENCOUNTER — Ambulatory Visit (HOSPITAL_COMMUNITY): Admitting: Anesthesiology

## 2024-01-08 DIAGNOSIS — M1711 Unilateral primary osteoarthritis, right knee: Secondary | ICD-10-CM

## 2024-01-08 DIAGNOSIS — M25561 Pain in right knee: Secondary | ICD-10-CM | POA: Diagnosis present

## 2024-01-08 HISTORY — PX: TOTAL KNEE ARTHROPLASTY: SHX125

## 2024-01-08 LAB — TYPE AND SCREEN
ABO/RH(D): A POS
Antibody Screen: NEGATIVE

## 2024-01-08 LAB — ABO/RH: ABO/RH(D): A POS

## 2024-01-08 SURGERY — ARTHROPLASTY, KNEE, TOTAL
Anesthesia: General | Site: Knee | Laterality: Right

## 2024-01-08 MED ORDER — CHLORHEXIDINE GLUCONATE 0.12 % MT SOLN
15.0000 mL | Freq: Once | OROMUCOSAL | Status: AC
  Administered 2024-01-08: 15 mL via OROMUCOSAL

## 2024-01-08 MED ORDER — PROPOFOL 1000 MG/100ML IV EMUL
INTRAVENOUS | Status: AC
  Filled 2024-01-08: qty 100

## 2024-01-08 MED ORDER — ROCURONIUM BROMIDE 10 MG/ML (PF) SYRINGE
PREFILLED_SYRINGE | INTRAVENOUS | Status: DC | PRN
  Administered 2024-01-08: 70 mg via INTRAVENOUS
  Administered 2024-01-08: 10 mg via INTRAVENOUS

## 2024-01-08 MED ORDER — LACTATED RINGERS IV BOLUS: 250.0000 mL | Freq: Once | INTRAVENOUS | Status: DC

## 2024-01-08 MED ORDER — BUPIVACAINE-EPINEPHRINE (PF) 0.25% -1:200000 IJ SOLN
INTRAMUSCULAR | Status: AC
  Filled 2024-01-08: qty 30

## 2024-01-08 MED ORDER — HYDROMORPHONE HCL 1 MG/ML IJ SOLN
INTRAMUSCULAR | Status: AC
  Filled 2024-01-08: qty 1

## 2024-01-08 MED ORDER — CIPROFLOXACIN IN D5W 400 MG/200ML IV SOLN
400.0000 mg | Freq: Three times a day (TID) | INTRAVENOUS | Status: DC
  Filled 2024-01-08: qty 200

## 2024-01-08 MED ORDER — POVIDONE-IODINE 10 % EX SWAB
2.0000 | Freq: Once | CUTANEOUS | Status: AC
  Administered 2024-01-08: 2 via TOPICAL

## 2024-01-08 MED ORDER — 0.9 % SODIUM CHLORIDE (POUR BTL) OPTIME
TOPICAL | Status: DC | PRN
  Administered 2024-01-08: 1000 mL

## 2024-01-08 MED ORDER — DIPHENHYDRAMINE HCL 12.5 MG/5ML PO ELIX: 12.5000 mg | ORAL_SOLUTION | ORAL | Status: DC | PRN

## 2024-01-08 MED ORDER — TRANEXAMIC ACID-NACL 1000-0.7 MG/100ML-% IV SOLN
1000.0000 mg | INTRAVENOUS | Status: DC
  Filled 2024-01-08: qty 100

## 2024-01-08 MED ORDER — MIDAZOLAM HCL 2 MG/2ML IJ SOLN
INTRAMUSCULAR | Status: AC
  Filled 2024-01-08: qty 2

## 2024-01-08 MED ORDER — FAMOTIDINE 20 MG PO TABS: 20.0000 mg | ORAL_TABLET | Freq: Every day | ORAL | Status: DC | PRN

## 2024-01-08 MED ORDER — OXYCODONE HCL 5 MG PO TABS: 5.0000 mg | ORAL_TABLET | ORAL | 0 refills | Status: AC | PRN

## 2024-01-08 MED ORDER — SODIUM CHLORIDE (PF) 0.9 % IJ SOLN
INTRAMUSCULAR | Status: AC
  Filled 2024-01-08: qty 30

## 2024-01-08 MED ORDER — HYDROMORPHONE HCL 1 MG/ML IJ SOLN
0.2500 mg | INTRAMUSCULAR | Status: DC | PRN
  Administered 2024-01-08: 0.5 mg via INTRAVENOUS

## 2024-01-08 MED ORDER — VANCOMYCIN HCL 1250 MG/250ML IV SOLN
1250.0000 mg | Freq: Two times a day (BID) | INTRAVENOUS | Status: DC
  Administered 2024-01-08 – 2024-01-09 (×2): 1250 mg via INTRAVENOUS
  Filled 2024-01-08 (×2): qty 250

## 2024-01-08 MED ORDER — PANTOPRAZOLE SODIUM 40 MG PO TBEC
40.0000 mg | DELAYED_RELEASE_TABLET | Freq: Every day | ORAL | Status: DC
  Administered 2024-01-08 – 2024-01-09 (×2): 40 mg via ORAL
  Filled 2024-01-08 (×2): qty 1

## 2024-01-08 MED ORDER — LACTATED RINGERS IV SOLN: INTRAVENOUS | Status: DC | PRN

## 2024-01-08 MED ORDER — ACETAMINOPHEN 325 MG PO TABS: 325.0000 mg | ORAL_TABLET | Freq: Four times a day (QID) | ORAL | Status: DC | PRN

## 2024-01-08 MED ORDER — SULFAMETHOXAZOLE-TRIMETHOPRIM 800-160 MG PO TABS: 1.0000 | ORAL_TABLET | Freq: Two times a day (BID) | ORAL | 0 refills | Status: AC

## 2024-01-08 MED ORDER — ONDANSETRON HCL 4 MG PO TABS: 4.0000 mg | ORAL_TABLET | Freq: Three times a day (TID) | ORAL | 0 refills | Status: AC | PRN

## 2024-01-08 MED ORDER — WATER FOR IRRIGATION, STERILE IR SOLN
Status: DC | PRN
  Administered 2024-01-08: 2000 mL

## 2024-01-08 MED ORDER — ZOLPIDEM TARTRATE 5 MG PO TABS: 5.0000 mg | ORAL_TABLET | Freq: Every evening | ORAL | Status: DC | PRN

## 2024-01-08 MED ORDER — SUGAMMADEX SODIUM 200 MG/2ML IV SOLN
INTRAVENOUS | Status: DC | PRN
  Administered 2024-01-08: 200 mg via INTRAVENOUS

## 2024-01-08 MED ORDER — ONDANSETRON HCL 4 MG PO TABS: 4.0000 mg | ORAL_TABLET | Freq: Four times a day (QID) | ORAL | Status: DC | PRN

## 2024-01-08 MED ORDER — SODIUM CHLORIDE (PF) 0.9 % IJ SOLN
INTRAMUSCULAR | Status: DC | PRN
  Administered 2024-01-08: 80 mL

## 2024-01-08 MED ORDER — SUGAMMADEX SODIUM 200 MG/2ML IV SOLN
INTRAVENOUS | Status: AC
  Filled 2024-01-08: qty 4

## 2024-01-08 MED ORDER — PHENOL 1.4 % MT LIQD: 1.0000 | OROMUCOSAL | Status: DC | PRN

## 2024-01-08 MED ORDER — BUPIVACAINE LIPOSOME 1.3 % IJ SUSP: 20.0000 mL | Freq: Once | INTRAMUSCULAR | Status: DC

## 2024-01-08 MED ORDER — ROPIVACAINE HCL 7.5 MG/ML IJ SOLN
INTRAMUSCULAR | Status: DC | PRN
  Administered 2024-01-08: 20 mL via PERINEURAL

## 2024-01-08 MED ORDER — ISOPROPYL ALCOHOL 70 % SOLN
Status: AC
  Filled 2024-01-08: qty 480

## 2024-01-08 MED ORDER — CIPROFLOXACIN IN D5W 400 MG/200ML IV SOLN
INTRAVENOUS | Status: DC | PRN
  Administered 2024-01-08: 400 mg via INTRAVENOUS

## 2024-01-08 MED ORDER — PHENYLEPHRINE HCL (PRESSORS) 10 MG/ML IV SOLN
INTRAVENOUS | Status: AC
  Filled 2024-01-08: qty 1

## 2024-01-08 MED ORDER — FENTANYL CITRATE (PF) 100 MCG/2ML IJ SOLN
INTRAMUSCULAR | Status: DC | PRN
  Administered 2024-01-08: 50 ug via INTRAVENOUS
  Administered 2024-01-08: 25 ug via INTRAVENOUS
  Administered 2024-01-08: 50 ug via INTRAVENOUS
  Administered 2024-01-08: 25 ug via INTRAVENOUS
  Administered 2024-01-08: 50 ug via INTRAVENOUS

## 2024-01-08 MED ORDER — MEPERIDINE HCL 25 MG/ML IJ SOLN: 6.2500 mg | INTRAMUSCULAR | Status: DC | PRN

## 2024-01-08 MED ORDER — ACETAMINOPHEN 500 MG PO TABS
1000.0000 mg | ORAL_TABLET | Freq: Once | ORAL | Status: AC
  Filled 2024-01-08: qty 2

## 2024-01-08 MED ORDER — ACETAMINOPHEN 500 MG PO TABS: 1000.0000 mg | ORAL_TABLET | Freq: Three times a day (TID) | ORAL | Status: AC | PRN

## 2024-01-08 MED ORDER — VANCOMYCIN HCL IN DEXTROSE 1-5 GM/200ML-% IV SOLN
1000.0000 mg | Freq: Once | INTRAVENOUS | Status: AC
  Administered 2024-01-08: 1000 mg via INTRAVENOUS
  Filled 2024-01-08: qty 200

## 2024-01-08 MED ORDER — METHOCARBAMOL 1000 MG/10ML IJ SOLN
500.0000 mg | Freq: Four times a day (QID) | INTRAMUSCULAR | Status: DC | PRN
  Administered 2024-01-08: 500 mg via INTRAVENOUS

## 2024-01-08 MED ORDER — ROCURONIUM BROMIDE 10 MG/ML (PF) SYRINGE
PREFILLED_SYRINGE | INTRAVENOUS | Status: AC
  Filled 2024-01-08: qty 10

## 2024-01-08 MED ORDER — FENTANYL CITRATE (PF) 100 MCG/2ML IJ SOLN
INTRAMUSCULAR | Status: AC
  Filled 2024-01-08: qty 2

## 2024-01-08 MED ORDER — CELECOXIB 100 MG PO CAPS: 100.0000 mg | ORAL_CAPSULE | Freq: Two times a day (BID) | ORAL | 0 refills | Status: AC

## 2024-01-08 MED ORDER — ISOPROPYL ALCOHOL 70 % SOLN
Status: DC | PRN
  Administered 2024-01-08: 1 via TOPICAL

## 2024-01-08 MED ORDER — PHENYLEPHRINE 80 MCG/ML (10ML) SYRINGE FOR IV PUSH (FOR BLOOD PRESSURE SUPPORT)
PREFILLED_SYRINGE | INTRAVENOUS | Status: AC
  Filled 2024-01-08: qty 10

## 2024-01-08 MED ORDER — HYDROMORPHONE HCL 1 MG/ML IJ SOLN: 0.5000 mg | INTRAMUSCULAR | Status: DC | PRN

## 2024-01-08 MED ORDER — POLYETHYLENE GLYCOL 3350 17 G PO PACK: 17.0000 g | PACK | Freq: Every day | ORAL | 0 refills | Status: AC

## 2024-01-08 MED ORDER — PROPOFOL 10 MG/ML IV BOLUS
INTRAVENOUS | Status: DC | PRN
  Administered 2024-01-08: 150 mg via INTRAVENOUS

## 2024-01-08 MED ORDER — MIDAZOLAM HCL 5 MG/5ML IJ SOLN
INTRAMUSCULAR | Status: DC | PRN
  Administered 2024-01-08: 1 mg via INTRAVENOUS

## 2024-01-08 MED ORDER — ONDANSETRON HCL 4 MG/2ML IJ SOLN: 4.0000 mg | Freq: Four times a day (QID) | INTRAMUSCULAR | Status: DC | PRN

## 2024-01-08 MED ORDER — OXYCODONE HCL 5 MG/5ML PO SOLN: 5.0000 mg | Freq: Once | ORAL | Status: DC | PRN

## 2024-01-08 MED ORDER — MENTHOL 3 MG MT LOZG: 1.0000 | LOZENGE | OROMUCOSAL | Status: DC | PRN

## 2024-01-08 MED ORDER — LACTATED RINGERS IV SOLN: INTRAVENOUS | Status: DC

## 2024-01-08 MED ORDER — ACETAMINOPHEN 500 MG PO TABS
1000.0000 mg | ORAL_TABLET | Freq: Four times a day (QID) | ORAL | Status: DC
  Administered 2024-01-08: 1000 mg via ORAL
  Filled 2024-01-08 (×2): qty 2

## 2024-01-08 MED ORDER — TRANEXAMIC ACID-NACL 1000-0.7 MG/100ML-% IV SOLN
INTRAVENOUS | Status: DC | PRN
  Administered 2024-01-08: 1000 mg via INTRAVENOUS

## 2024-01-08 MED ORDER — OXYCODONE HCL 5 MG PO TABS: 5.0000 mg | ORAL_TABLET | Freq: Once | ORAL | Status: DC | PRN

## 2024-01-08 MED ORDER — OXYCODONE HCL 5 MG PO TABS: 5.0000 mg | ORAL_TABLET | ORAL | Status: DC | PRN

## 2024-01-08 MED ORDER — DEXAMETHASONE SOD PHOSPHATE PF 10 MG/ML IJ SOLN
INTRAMUSCULAR | Status: DC | PRN
  Administered 2024-01-08: 10 mg via INTRAVENOUS

## 2024-01-08 MED ORDER — BUPIVACAINE LIPOSOME 1.3 % IJ SUSP
INTRAMUSCULAR | Status: AC
  Filled 2024-01-08: qty 20

## 2024-01-08 MED ORDER — VANCOMYCIN HCL IN DEXTROSE 1-5 GM/200ML-% IV SOLN
1000.0000 mg | Freq: Two times a day (BID) | INTRAVENOUS | Status: DC
  Filled 2024-01-08: qty 200

## 2024-01-08 MED ORDER — MIDAZOLAM HCL (PF) 2 MG/2ML IJ SOLN: 0.5000 mg | Freq: Once | INTRAMUSCULAR | Status: DC | PRN

## 2024-01-08 MED ORDER — ONDANSETRON HCL 4 MG/2ML IJ SOLN
INTRAMUSCULAR | Status: DC | PRN
  Administered 2024-01-08: 4 mg via INTRAVENOUS

## 2024-01-08 MED ORDER — METHOCARBAMOL 500 MG PO TABS
500.0000 mg | ORAL_TABLET | Freq: Four times a day (QID) | ORAL | Status: DC | PRN
  Administered 2024-01-09: 500 mg via ORAL
  Filled 2024-01-08: qty 1

## 2024-01-08 MED ORDER — METHOCARBAMOL 500 MG PO TABS: 500.0000 mg | ORAL_TABLET | Freq: Three times a day (TID) | ORAL | 0 refills | Status: AC | PRN

## 2024-01-08 MED ORDER — SODIUM CHLORIDE 0.9 % IV SOLN: INTRAVENOUS | Status: DC

## 2024-01-08 MED ORDER — KETOROLAC TROMETHAMINE 15 MG/ML IJ SOLN
7.5000 mg | Freq: Four times a day (QID) | INTRAMUSCULAR | Status: AC
  Administered 2024-01-08 – 2024-01-09 (×4): 7.5 mg via INTRAVENOUS
  Filled 2024-01-08 (×4): qty 1

## 2024-01-08 MED ORDER — ASPIRIN 81 MG PO TBEC: 81.0000 mg | DELAYED_RELEASE_TABLET | Freq: Two times a day (BID) | ORAL | Status: AC

## 2024-01-08 MED ORDER — PROPOFOL 10 MG/ML IV BOLUS
INTRAVENOUS | Status: AC
  Filled 2024-01-08: qty 20

## 2024-01-08 MED ORDER — CIPROFLOXACIN IN D5W 400 MG/200ML IV SOLN
400.0000 mg | Freq: Two times a day (BID) | INTRAVENOUS | Status: DC
  Administered 2024-01-08 – 2024-01-09 (×2): 400 mg via INTRAVENOUS
  Filled 2024-01-08 (×2): qty 200

## 2024-01-08 MED ORDER — ORAL CARE MOUTH RINSE: 15.0000 mL | Freq: Once | OROMUCOSAL | Status: AC

## 2024-01-08 MED ORDER — LACTATED RINGERS IV BOLUS: 500.0000 mL | Freq: Once | INTRAVENOUS | Status: DC

## 2024-01-08 MED ORDER — DEXAMETHASONE SOD PHOSPHATE PF 10 MG/ML IJ SOLN: 8.0000 mg | Freq: Once | INTRAMUSCULAR | Status: DC

## 2024-01-08 MED ORDER — LIDOCAINE HCL (PF) 2 % IJ SOLN
INTRAMUSCULAR | Status: DC | PRN
  Administered 2024-01-08 (×2): 50 mg via INTRADERMAL

## 2024-01-08 MED ORDER — EPHEDRINE SULFATE (PRESSORS) 25 MG/5ML IV SOSY
PREFILLED_SYRINGE | INTRAVENOUS | Status: DC | PRN
  Administered 2024-01-08: 5 mg via INTRAVENOUS
  Administered 2024-01-08 (×2): 10 mg via INTRAVENOUS

## 2024-01-08 MED ORDER — ONDANSETRON HCL 4 MG/2ML IJ SOLN
INTRAMUSCULAR | Status: AC
  Filled 2024-01-08: qty 2

## 2024-01-08 MED ORDER — ACETAMINOPHEN 500 MG PO TABS
1000.0000 mg | ORAL_TABLET | Freq: Once | ORAL | Status: AC
  Administered 2024-01-08: 1000 mg via ORAL

## 2024-01-08 MED ORDER — POLYETHYLENE GLYCOL 3350 17 G PO PACK: 17.0000 g | PACK | Freq: Every day | ORAL | Status: DC | PRN

## 2024-01-08 MED ORDER — METHOCARBAMOL 1000 MG/10ML IJ SOLN
INTRAMUSCULAR | Status: AC
  Filled 2024-01-08: qty 10

## 2024-01-08 MED ORDER — ASPIRIN 81 MG PO CHEW
81.0000 mg | CHEWABLE_TABLET | Freq: Two times a day (BID) | ORAL | Status: DC
  Administered 2024-01-08 – 2024-01-09 (×2): 81 mg via ORAL
  Filled 2024-01-08 (×2): qty 1

## 2024-01-08 MED ORDER — SODIUM CHLORIDE 0.9 % IR SOLN
Status: DC | PRN
  Administered 2024-01-08: 3000 mL

## 2024-01-08 MED ORDER — DOCUSATE SODIUM 100 MG PO CAPS
100.0000 mg | ORAL_CAPSULE | Freq: Two times a day (BID) | ORAL | Status: DC
  Administered 2024-01-08 – 2024-01-09 (×2): 100 mg via ORAL
  Filled 2024-01-08 (×2): qty 1

## 2024-01-08 SURGICAL SUPPLY — 52 items
BAG COUNTER SPONGE SURGICOUNT (BAG) IMPLANT
BLADE SAG 18X100X1.27 (BLADE) ×1 IMPLANT
BLADE SAW SAG 35X64 .89 (BLADE) ×1 IMPLANT
BLADE SAW SGTL 11.0X1.19X90.0M (BLADE) IMPLANT
BNDG COHESIVE 3X5 TAN ST LF (GAUZE/BANDAGES/DRESSINGS) ×1 IMPLANT
BNDG ELASTIC 6X10 VLCR STRL LF (GAUZE/BANDAGES/DRESSINGS) ×1 IMPLANT
BOWL SMART MIX CTS (DISPOSABLE) IMPLANT
CEMENT BONE R 1X40 (Cement) IMPLANT
CEMENT RESTRICTOR BONE PREP ST (Cement) IMPLANT
CHLORAPREP W/TINT 26 (MISCELLANEOUS) ×2 IMPLANT
CNTNR URN SCR LID CUP LEK RST (MISCELLANEOUS) IMPLANT
COMPONENT FEM CMT PRNSA SZ10RT (Joint) IMPLANT
COVER SURGICAL LIGHT HANDLE (MISCELLANEOUS) ×1 IMPLANT
CUFF TRNQT CYL 34X4.125X (TOURNIQUET CUFF) ×1 IMPLANT
DERMABOND ADVANCED .7 DNX12 (GAUZE/BANDAGES/DRESSINGS) ×2 IMPLANT
DRAPE INCISE IOBAN 85X60 (DRAPES) ×1 IMPLANT
DRAPE SHEET LG 3/4 BI-LAMINATE (DRAPES) ×1 IMPLANT
DRAPE U-SHAPE 47X51 STRL (DRAPES) ×1 IMPLANT
DRSG AQUACEL AG ADV 3.5X10 (GAUZE/BANDAGES/DRESSINGS) ×1 IMPLANT
ELECT REM PT RETURN 15FT ADLT (MISCELLANEOUS) ×1 IMPLANT
GAUZE SPONGE 4X4 12PLY STRL (GAUZE/BANDAGES/DRESSINGS) ×1 IMPLANT
GLOVE BIO SURGEON STRL SZ 6.5 (GLOVE) ×2 IMPLANT
GLOVE BIOGEL PI IND STRL 6.5 (GLOVE) ×1 IMPLANT
GLOVE BIOGEL PI IND STRL 8 (GLOVE) ×1 IMPLANT
GLOVE SURG ORTHO 8.0 STRL STRW (GLOVE) ×2 IMPLANT
GOWN STRL REUS W/ TWL XL LVL3 (GOWN DISPOSABLE) ×2 IMPLANT
HOLDER FOLEY CATH W/STRAP (MISCELLANEOUS) ×1 IMPLANT
HOOD PEEL AWAY T7 (MISCELLANEOUS) ×3 IMPLANT
INSERT TIB ARTI SZ8-11 RT 11 (Joint) IMPLANT
KIT TURNOVER KIT A (KITS) ×1 IMPLANT
MANIFOLD NEPTUNE II (INSTRUMENTS) ×1 IMPLANT
MARKER SKIN DUAL TIP RULER LAB (MISCELLANEOUS) ×1 IMPLANT
NS IRRIG 1000ML POUR BTL (IV SOLUTION) ×1 IMPLANT
PACK TOTAL KNEE CUSTOM (KITS) ×1 IMPLANT
PENCIL SMOKE EVACUATOR (MISCELLANEOUS) ×1 IMPLANT
PIN DRILL HDLS TROCAR 75 4PK (PIN) IMPLANT
SCREW HEADED 33MM KNEE (MISCELLANEOUS) IMPLANT
SET HNDPC FAN SPRY TIP SCT (DISPOSABLE) ×1 IMPLANT
SOLUTION IRRIG SURGIPHOR (IV SOLUTION) IMPLANT
SOLUTION PRONTOSAN WOUND 350ML (IRRIGATION / IRRIGATOR) IMPLANT
STEM POLY PAT PLY 38M KNEE (Knees) IMPLANT
STEM TIB ST PERS 14+30 (Stem) IMPLANT
STEM TIBIA 5 DEG SZ F R KNEE (Knees) IMPLANT
STRIP CLOSURE SKIN 1/2X4 (GAUZE/BANDAGES/DRESSINGS) ×1 IMPLANT
SUT MNCRL AB 3-0 PS2 18 (SUTURE) ×1 IMPLANT
SUT STRATAFIX 14 PDO 48 VLT (SUTURE) ×1 IMPLANT
SUT VIC AB 2-0 CT2 27 (SUTURE) ×2 IMPLANT
SUTURE STRATFX 0 PDS 27 VIOLET (SUTURE) ×1 IMPLANT
TRAY FOLEY MTR SLVR 14FR STAT (SET/KITS/TRAYS/PACK) IMPLANT
TUBE SUCTION HIGH CAP CLEAR NV (SUCTIONS) ×1 IMPLANT
UNDERPAD 30X36 HEAVY ABSORB (UNDERPADS AND DIAPERS) ×1 IMPLANT
WRAP KNEE MAXI GEL POST OP (GAUZE/BANDAGES/DRESSINGS) ×1 IMPLANT

## 2024-01-08 NOTE — Transfer of Care (Signed)
 Immediate Anesthesia Transfer of Care Note  Patient: Alan Leblanc  Procedure(s) Performed: ARTHROPLASTY, KNEE, TOTAL (Right: Knee)  Patient Location: PACU  Anesthesia Type:General  Level of Consciousness: awake, alert , and oriented  Airway & Oxygen Therapy: Patient Spontanous Breathing and Patient connected to face mask oxygen  Post-op Assessment: Report given to RN and Post -op Vital signs reviewed and stable  Post vital signs: Reviewed and stable  Last Vitals:  Vitals Value Taken Time  BP 140/90 01/08/24 09:46  Temp 36.6 C 01/08/24 09:46  Pulse 84 01/08/24 09:48  Resp 13 01/08/24 09:48  SpO2 100 % 01/08/24 09:48  Vitals shown include unfiled device data.  Last Pain:  Vitals:   01/08/24 0640  TempSrc:   PainSc: 0-No pain      Patients Stated Pain Goal: 4 (01/08/24 0630)  Complications: No notable events documented.

## 2024-01-08 NOTE — Interval H&P Note (Signed)

## 2024-01-08 NOTE — Evaluation (Signed)
 Physical Therapy Evaluation Patient Details Name: Alan Leblanc MRN: [id number] DOB: 05/22/1960 Today's Date: 01/08/2024  History of Present Illness  63 yo male presents to therapy s/p R TKA on 01/08/2024 due to failure of conservative measures. Pt PMH includes but is not limited to: hernia, GERD, anemia, cervical, rib and skull fx, OSA, and R ankle fx.  Clinical Impression    Alan Leblanc is a 63 y.o. male POD 0 s/p R TKA. Patient reports IND with mobility at baseline. Patient is now limited by functional impairments (see PT problem list below) and requires CGA for bed mobility and CGA for transfers. Patient was able to ambulate 60 feet with RW and CGA level of assist. Patient instructed in exercise to facilitate ROM and circulation to manage edema. Patient will benefit from continued skilled PT interventions to address impairments and progress towards PLOF. Acute PT will follow to progress mobility and stair training in preparation for safe discharge to daughter's home with strong family support and OPPT services.       If plan is discharge home, recommend the following: A little help with walking and/or transfers;A little help with bathing/dressing/bathroom;Assistance with cooking/housework;Assist for transportation;Help with stairs or ramp for entrance   Can travel by private vehicle        Equipment Recommendations Rolling walker (2 wheels)  Recommendations for Other Services       Functional Status Assessment Patient has had a recent decline in their functional status and demonstrates the ability to make significant improvements in function in a reasonable and predictable amount of time.     Precautions / Restrictions Precautions Precautions: Fall;Knee Restrictions Weight Bearing Restrictions Per Provider Order: No      Mobility  Bed Mobility Overal bed mobility: Needs Assistance Bed Mobility: Supine to Sit     Supine to sit: Supervision, HOB elevated     General  bed mobility comments: min cues    Transfers Overall transfer level: Needs assistance Equipment used: Rolling walker (2 wheels) Transfers: Sit to/from Stand Sit to Stand: Contact guard assist           General transfer comment: min cues pull to stand    Ambulation/Gait Ambulation/Gait assistance: Contact guard assist Gait Distance (Feet): 60 Feet Assistive device: Rolling walker (2 wheels) Gait Pattern/deviations: Step-to pattern, Decreased stance time - right, Antalgic Gait velocity: decreased     General Gait Details: slight trunk flexion with B UE support at RW to offload R LE in stance phase, cues for RW management including maintaining RW on floor  Stairs            Wheelchair Mobility     Tilt Bed    Modified Rankin (Stroke Patients Only)       Balance Overall balance assessment: Needs assistance Sitting-balance support: Feet supported Sitting balance-Leahy Scale: Normal     Standing balance support: Bilateral upper extremity supported, During functional activity, Reliant on assistive device for balance Standing balance-Leahy Scale: Poor                               Pertinent Vitals/Pain Pain Assessment Pain Assessment: 0-10 Pain Score: 4  Pain Location: R knee and LE Pain Descriptors / Indicators: Aching, Constant, Discomfort, Dull, Operative site guarding Pain Intervention(s): Limited activity within patient's tolerance, Monitored during session, Premedicated before session, Repositioned, Ice applied    Home Living Family/patient expects to be discharged to:: Private residence Living Arrangements: Spouse/significant  other;Children Available Help at Discharge: Family Type of Home: House Home Access: Stairs to enter Entrance Stairs-Rails: Right Entrance Stairs-Number of Steps: 6   Home Layout: One level Home Equipment: None Additional Comments: pt will d/c to daughters home, information as above per home setting, pt typically  lives with spouse in a 2 story home with B and B on second floor and several steps to enter    Prior Function Prior Level of Function : Independent/Modified Independent;Working/employed;Driving             Mobility Comments: IND no AD for all ADLs, self care tasks and IADLs       Extremity/Trunk Assessment        Lower Extremity Assessment Lower Extremity Assessment: RLE deficits/detail RLE Deficits / Details: ankle DF/PF 5/5; SLR < 10 degree lag RLE Sensation: WNL    Cervical / Trunk Assessment Cervical / Trunk Assessment: Other exceptions (c-spine fx)  Communication   Communication Communication: No apparent difficulties    Cognition Arousal: Alert Behavior During Therapy: WFL for tasks assessed/performed   PT - Cognitive impairments: No apparent impairments                         Following commands: Intact       Cueing       General Comments      Exercises Total Joint Exercises Ankle Circles/Pumps: AROM, Both, 10 reps Heel Slides: AROM, Right, 5 reps Straight Leg Raises: AROM, Right, 5 reps   Assessment/Plan    PT Assessment Patient needs continued PT services  PT Problem List Decreased strength;Decreased range of motion;Decreased balance;Decreased activity tolerance;Decreased mobility;Decreased coordination;Pain       PT Treatment Interventions DME instruction;Gait training;Stair training;Functional mobility training;Therapeutic activities;Therapeutic exercise;Balance training;Neuromuscular re-education;Patient/family education;Modalities    PT Goals (Current goals can be found in the Care Plan section)  Acute Rehab PT Goals Patient Stated Goal: to be able to walk for excercise and loose weight return to work PT Goal Formulation: With patient Time For Goal Achievement: 01/23/24 Potential to Achieve Goals: Good    Frequency 7X/week     Co-evaluation               AM-PAC PT 6 Clicks Mobility  Outcome Measure Help needed  turning from your back to your side while in a flat bed without using bedrails?: None Help needed moving from lying on your back to sitting on the side of a flat bed without using bedrails?: A Little Help needed moving to and from a bed to a chair (including a wheelchair)?: A Little Help needed standing up from a chair using your arms (e.g., wheelchair or bedside chair)?: A Little Help needed to walk in hospital room?: A Little Help needed climbing 3-5 steps with a railing? : Total 6 Click Score: 17    End of Session Equipment Utilized During Treatment: Gait belt Activity Tolerance: Patient tolerated treatment well Patient left: in chair;with call bell/phone within reach;with chair alarm set Nurse Communication: Mobility status PT Visit Diagnosis: Unsteadiness on feet (R26.81);Other abnormalities of gait and mobility (R26.89);Muscle weakness (generalized) (M62.81);Difficulty in walking, not elsewhere classified (R26.2);Pain Pain - Right/Left: Right Pain - part of body: Knee;Leg    Time: 8589-8575 PT Time Calculation (min) (ACUTE ONLY): 14 min   Charges:   PT Evaluation $PT Eval Low Complexity: 1 Low   PT General Charges $$ ACUTE PT VISIT: 1 Visit         [place], PT Acute  Rehab   [place] VEAR Drone 01/08/2024, 4:06 PM

## 2024-01-08 NOTE — Op Note (Signed)
 DATE OF SURGERY:  01/08/2024 TIME: 9:18 AM  PATIENT NAME:  Alan Leblanc   AGE: 63 y.o.    PRE-OPERATIVE DIAGNOSIS: End-stage right knee osteoarthritis  POST-OPERATIVE DIAGNOSIS:  Same  PROCEDURE: Cemented right total Knee Arthroplasty  SURGEON:  Kayanna Mckillop A Antonisha Waskey, MD   ASSISTANT: Bernarda Mclean, PA-C, present and scrubbed throughout the case, critical for assistance with exposure, retraction, instrumentation, and closure.   OPERATIVE IMPLANTS:  Cemented Zimmer persona size 10 right femur standard, right size F tibial baseplate, 30 mm tibial stem extension, 11 mm MC poly insert, 38 mm cemented three-peg patella Implant Name Type Inv. Item Serial No. Manufacturer Lot No. LRB No. Used Action  STEM TIBIA 5 DEG SZ F R KNEE - [id number] Knees STEM TIBIA 5 DEG SZ F R KNEE  [employer]MER [employer](ORTH,TRAU,BIO,SG) [id number] Right 1 Implanted  STEM TIB ST PERS 14+30 - [id number] Stem STEM TIB ST PERS 14+30  [employer](ORTH,TRAU,BIO,SG) [id number] Right 1 Implanted  STEM POLY PAT PLY 30M KNEE - [id number] Knees STEM POLY PAT PLY 30M KNEE  Z[employer]MER [employer]ON(ORTH,TRAU,BIO,SG) [id number] Right 1 Implanted  COMPONENT FEM CMT PRNSA SZ10RT - [id number] Joint COMPONENT FEM CMT PRNSA SZ10RT  [employer]MER [employer]ON(ORTH,TRAU,BIO,SG) [id number] Right 1 Implanted  CEMENT BONE R 1X40 - [id number] Cement CEMENT BONE R 1X40  ZIMMER [employer]ON(ORTH,TRAU,BIO,SG) [id number] Right 1 Implanted  CEMENT BONE R 1X40 - [id number] Cement CEMENT BONE R 1X40  ZIMMER [employer]ON(ORTH,TRAU,BIO,SG) [id number] Right 2 Implanted  CEMENT RESTRICTOR BONE PREP ST - [id number] Cement CEMENT RESTRICTOR BONE PREP ST  STRYKER INSTRUMENTS [id number] Right 1 Implanted  INSERT TIB ARTI SZ8-11 RT 11 - [id number] Joint INSERT TIB ARTI SZ8-11 RT 11  ZIMMER [employer]ON(ORTH,TRAU,BIO,SG) [id number] Right 1 Implanted      PREOPERATIVE INDICATIONS:  Alan Leblanc is a 63 y.o. year old male with end stage bone on bone degenerative arthritis of the knee who failed conservative  treatment, including injections, antiinflammatories, activity modification, and assistive devices, and had significant impairment of their activities of daily living, and elected for Total Knee Arthroplasty.   The risks, benefits, and alternatives were discussed at length including but not limited to the risks of infection, bleeding, nerve injury, stiffness, blood clots, the need for revision surgery, cardiopulmonary complications, among others, and they were willing to proceed.  OPERATIVE FINDINGS AND UNIQUE ASPECTS OF THE CASE: Severe rheumatoid arthritis with typical appearing synovitis.  Upon prepping the tibial canal there was found to be a large tibial cyst.  Elected to send some of the cystic tissue for pathology and culture.  Also elected for 30 mm stem extension and extra batch of cement yeah given the size of the metaphyseal cyst.  ESTIMATED BLOOD LOSS: 50cc  OPERATIVE DESCRIPTION:   Once adequate anesthesia was induced, preoperative antibiotics, 1 gm of vanc and 400mg  cipro ,1 gm of Tranexamic Acid , and 8 mg of Decadron  administered, the patient was positioned supine with a right thigh tourniquet placed.  The right lower extremity was prepped and draped in sterile fashion.  A time-  out was performed identifying the patient, planned procedure, and the appropriate extremity.     The leg was  exsanguinated, tourniquet elevated to 250 mmHg.  A midline incision was  made followed by median parapatellar arthrotomy. Anterior horn of the medial meniscus was released and resected. A medial release was performed, the infrapatellar fat pad was resected with care taken to protect the patellar tendon. The suprapatellar fat was removed to exposed the distal anterior femur. The anterior horn of the lateral meniscus  and ACL were released.    Following initial  exposure, I first started with the femur  The femoral  canal was opened with a drill, canal was suctioned to try to prevent fat emboli.  An   intramedullary rod was passed set at 6 degrees valgus, 11 mm given severe preop flexion contracture. The distal femur was resected.  Following this resection, the tibia was  subluxated anteriorly.  Using the extramedullary guide, 10 mm of bone was resected off   the proximal lateral tibia.  We confirmed the gap would be  stable medially and laterally with a size 10mm spacer block as well as confirmed that the tibial cut was perpendicular in the coronal plane, checking with an alignment rod.    Once this was done, the posterior femoral referencing femoral sizer was placed under to the posterior condyles with 0 degrees of external rotational which was parallel to the transepicondylar axis and perpendicular to [employer]. The femur was sized to be a size 10 in the anterior-  posterior dimension. The  anterior, posterior, and  chamfer cuts were made without difficulty nor   notching making certain that I was along the anterior cortex to help  with flexion gap stability. Next a laminar spreader was placed with the knee in flexion and the medial lateral menisci were resected.  5 cc of the Exparel  mixture was injected in the medial side of the back of the knee and 3 cc in the lateral side.  1/2 inch curved osteotome was used to resect posterior osteophyte that was then removed with a pituitary rongeur.       At this point, the tibia was sized to be a size F.  The size F tray was  then pinned in position. Trial reduction was now carried with a 10 femur, F tibia, a 10 mm MC insert.  The knee had full extension and was stable to varus valgus stress in extension.  The knee was slightly tight in flexion and the PCL was partially released.   Attention was next directed to the patella.  Precut  measurement was noted to be 26 mm.  I resected down to 16 mm and used a  38mm patellar button to restore patellar height as well as cover the cut surface.     The patella lug holes were drilled and a 38 mm patella poly  trial was placed.    The knee was brought to full extension with good flexion stability with the patella tracking through the trochlea without application of pressure.     Next the femoral component was again assessed and determined to be seated and appropriately lateralized.  The femoral lug holes were drilled.  The femoral component was then removed. Tibial component was again assessed and felt to be seated and appropriately rotated with the medial third of the tubercle. The tibia was then drilled, and keel punched.  Upon drilling and keel punching we noticed that there was a large hollow metaphyseal cyst.  Elected to convert to a cemented total knee.  The proximal tibia was then thoroughly irrigated and debrided.  Cystic tissue from the lesion was sent for aerobic and anaerobic culture as well as pathology.  Added a 30mm stem extension on the tibia side.  The knee was thoroughly irrigated as well with Prontosan irrigation and surgery for irrigation.  Also a cement restrictor was placed distally.   Final components were  opened and  cement was mixed.  Final implants were then  cemented onto cleaned and dried cut surfaces of bone with the knee brought to extension with a 11 mm MC poly.  The knee was irrigated with sterile Betadine  diluted in saline as well as pulse lavage normal saline.  The synovial lining was  then injected a dilute Exparel  with 30cc of 0.25% marcaine  with epinephrine .         Once the cement had fully cured, excess cement was removed throughout the knee.  I confirmed that I was satisfied with the range of motion and stability, and the final 11 mm MC poly insert was chosen.  It was placed into the knee.         The tourniquet had been let down at .  No significant hemostasis was required.  The medial parapatellar arthrotomy was then reapproximated using #1 Stratafix sutures with the knee  in flexion.  The remaining wound was closed with 0 stratafix, 2-0 Vicryl, and  running 3-0 Monocryl. The knee was cleaned, dried, dressed sterilely using Dermabond and   Aquacel dressing.  The patient was then brought to recovery room in stable condition, tolerating the procedure  well. There were no complications.   Post op recs: WB: WBAT Abx: Vanco and Cipro  given history of severe penicillin allergy Imaging: PACU xrays DVT prophylaxis: Aspirin  81mg  BID x4 weeks  Follow up: 2 weeks after surgery for a wound check with Dr. Edna at [facility].  Address: [address], [place], [place] [address]  Office Phone: [phone]  Toribio Edna, MD Orthopaedic Surgery

## 2024-01-08 NOTE — Anesthesia Postprocedure Evaluation (Signed)
"   Anesthesia Post Note  Patient: Alan Leblanc  Procedure(s) Performed: ARTHROPLASTY, KNEE, TOTAL (Right: Knee)     Patient location during evaluation: PACU Anesthesia Type: General Level of consciousness: sedated, oriented and patient cooperative Pain management: pain level controlled Vital Signs Assessment: post-procedure vital signs reviewed and stable Respiratory status: spontaneous breathing, nonlabored ventilation and respiratory function stable Cardiovascular status: blood pressure returned to baseline and stable Postop Assessment: no apparent nausea or vomiting Anesthetic complications: no   No notable events documented.  Last Vitals:  Vitals:   01/08/24 0542 01/08/24 0946  BP: 129/79 (!) 140/90  Pulse: (!) 59 84  Resp: 17 12  Temp: 36.7 C 36.6 C  SpO2: 96% 100%    Last Pain:  Vitals:   01/08/24 0946  TempSrc:   PainSc: Asleep                 Trevion Hoben,E. Jillian Warth      "

## 2024-01-08 NOTE — Discharge Instructions (Signed)

## 2024-01-08 NOTE — Anesthesia Procedure Notes (Signed)
 Anesthesia Regional Block: Adductor canal block   Pre-Anesthetic Checklist: , timeout performed,  Correct Patient, Correct Site, Correct Laterality,  Correct Procedure, Correct Position, site marked,  Risks and benefits discussed,  Surgical consent,  Pre-op evaluation,  At surgeon's request and post-op pain management  Laterality: Right and Lower  Prep: chloraprep       Needles:  Injection technique: Single-shot  Needle Type: Echogenic Needle     Needle Length: 9cm  Needle Gauge: 21     Additional Needles:   Procedures:,,,, ultrasound used (permanent image in chart),,    Narrative:  Start time: 01/08/2024 6:49 AM End time: 01/08/2024 6:55 AM Injection made incrementally with aspirations every 5 mL.  Performed by: Personally  Anesthesiologist: Leonce Athens, MD  Additional Notes: Pt identified in Holding room.  Monitors applied. Working IV access confirmed. Timeout, Sterile prep R thigh.  #21ga ECHOgenic Arrow block needle into adductor canal with US  guidance.  20cc 0.75% Ropivacaine  injected incrementally after negative test dose.  Patient asymptomatic, VSS, no heme aspirated, tolerated well.   JAYSON Leonce, MD

## 2024-01-08 NOTE — Anesthesia Procedure Notes (Signed)
 Procedure Name: Intubation Date/Time: 01/08/2024 7:34 AM  Performed by: Obadiah Reyes BROCKS, CRNAPre-anesthesia Checklist: Patient identified, Emergency Drugs available, Suction available and Patient being monitored Patient Re-evaluated:Patient Re-evaluated prior to induction Oxygen Delivery Method: Circle System Utilized Preoxygenation: Pre-oxygenation with 100% oxygen Induction Type: IV induction Ventilation: Mask ventilation without difficulty Laryngoscope Size: Glidescope and 3 Grade View: Grade IV Tube type: Oral Tube size: 7.5 mm Number of attempts: 1 Airway Equipment and Method: Stylet and Oral airway Placement Confirmation: ETT inserted through vocal cords under direct vision, positive ETCO2 and breath sounds checked- equal and bilateral Secured at: 23 cm Tube secured with: Tape Dental Injury: Teeth and Oropharynx as per pre-operative assessment

## 2024-01-08 NOTE — Progress Notes (Signed)
 Orthopedic Tech Progress Note Patient Details:  [name] 16-Oct-1960 [id number] Applied bone foam per order. Ortho Devices Type of Ortho Device: Bone foam zero knee Ortho Device/Splint Location: RLE Ortho Device/Splint Interventions: Ordered, Application, Adjustment   Post Interventions Patient Tolerated: Well Instructions Provided: Adjustment of device, Care of device, Poper ambulation with device  Morna Pink 01/08/2024, 10:53 AM

## 2024-01-08 NOTE — Progress Notes (Signed)
 Pharmacy Antibiotic Note  Alan Leblanc is a 63 y.o. male admitted on 01/08/2024 with need for surgical prophx.  Pharmacy has been consulted for Vanco, Cipro  dosing.  Active Problem(s): R knee OA s/p 12/29 R TKA  ID: Empiric for h/o infection in lower extremity that required months of IV antibiotic treatment   12/29 Vanco>> 12/29 Cipro >>  Plan: - Cipro  400mg  IV q12 hrs - Vancomycin  1250 mg IV Q 12 hrs. Goal AUC 400-550. Expected AUC: 519 SCr used: 0.8    Height: 5' 6 (167.6 cm) Weight: 86.7 kg (191 lb 3.2 oz) IBW/kg (Calculated) : 63.8  Temp (24hrs), Avg:97.9 F (36.6 C), Min:97.8 F (36.6 C), Max:98 F (36.7 C)  No results for input(s): WBC, CREATININE, LATICACIDVEN, VANCOTROUGH, VANCOPEAK, VANCORANDOM, GENTTROUGH, GENTPEAK, GENTRANDOM, TOBRATROUGH, TOBRAPEAK, TOBRARND, AMIKACINPEAK, AMIKACINTROU, AMIKACIN in the last 168 hours.  Estimated Creatinine Clearance: 100.1 mL/min (by C-G formula based on SCr of 0.75 mg/dL).    Allergies[1]  Earsel Shouse Karoline Marina, PharmD, BCPS Clinical Staff Pharmacist  Marina Salines Stillinger 01/08/2024 11:18 AM     [1]  Allergies Allergen Reactions   Penicillins Other (See Comments)    Unsure of reaction

## 2024-01-08 NOTE — Plan of Care (Signed)
" °  Problem: Education: Goal: Knowledge of General Education information will improve Description: Including pain rating scale, medication(s)/side effects and non-pharmacologic comfort measures Outcome: Progressing   Problem: Health Behavior/Discharge Planning: Goal: Ability to manage health-related needs will improve Outcome: Progressing   Problem: Clinical Measurements: Goal: Ability to maintain clinical measurements within normal limits will improve Outcome: Progressing Goal: Will remain free from infection Outcome: Progressing Goal: Diagnostic test results will improve Outcome: Progressing Goal: Respiratory complications will improve Outcome: Progressing Goal: Cardiovascular complication will be avoided Outcome: Progressing   Problem: Activity: Goal: Risk for activity intolerance will decrease Outcome: Progressing   Problem: Nutrition: Goal: Adequate nutrition will be maintained Outcome: Progressing   Problem: Coping: Goal: Level of anxiety will decrease Outcome: Progressing   Problem: Elimination: Goal: Will not experience complications related to bowel motility Outcome: Progressing Goal: Will not experience complications related to urinary retention Outcome: Progressing   Problem: Pain Managment: Goal: General experience of comfort will improve and/or be controlled Outcome: Progressing   Problem: Safety: Goal: Ability to remain free from injury will improve Outcome: Progressing   Problem: Skin Integrity: Goal: Risk for impaired skin integrity will decrease Outcome: Progressing   Problem: Education: Goal: Knowledge of the prescribed therapeutic regimen will improve Outcome: Progressing   Problem: Bowel/Gastric: Goal: Gastrointestinal status for postoperative course will improve Outcome: Progressing   Problem: Cardiac: Goal: Ability to maintain an adequate cardiac output Outcome: Progressing Goal: Will show no evidence of cardiac arrhythmias Outcome:  Progressing   Problem: Nutritional: Goal: Will attain and maintain optimal nutritional status Outcome: Progressing   Problem: Neurological: Goal: Will regain or maintain usual level of consciousness Outcome: Progressing   Problem: Clinical Measurements: Goal: Ability to maintain clinical measurements within normal limits Outcome: Progressing Goal: Postoperative complications will be avoided or minimized Outcome: Progressing   Problem: Respiratory: Goal: Will regain and/or maintain adequate ventilation Outcome: Progressing Goal: Respiratory status will improve Outcome: Progressing   Problem: Skin Integrity: Goal: Demonstrates signs of wound healing without infection Outcome: Progressing   Problem: Urinary Elimination: Goal: Will remain free from infection Outcome: Progressing Goal: Ability to achieve and maintain adequate urine output Outcome: Progressing   Problem: Education: Goal: Knowledge of the prescribed therapeutic regimen will improve Outcome: Progressing Goal: Individualized Educational Video(s) Outcome: Progressing   Problem: Activity: Goal: Ability to avoid complications of mobility impairment will improve Outcome: Progressing Goal: Range of joint motion will improve Outcome: Progressing   Problem: Clinical Measurements: Goal: Postoperative complications will be avoided or minimized Outcome: Progressing   Problem: Pain Management: Goal: Pain level will decrease with appropriate interventions Outcome: Progressing   Problem: Skin Integrity: Goal: Will show signs of wound healing Outcome: Progressing   "

## 2024-01-09 ENCOUNTER — Encounter (HOSPITAL_COMMUNITY): Payer: Self-pay | Admitting: Orthopedic Surgery

## 2024-01-09 DIAGNOSIS — M1711 Unilateral primary osteoarthritis, right knee: Secondary | ICD-10-CM | POA: Diagnosis not present

## 2024-01-09 LAB — CBC
HCT: 40.2 % (ref 39.0–52.0)
Hemoglobin: 13.7 g/dL (ref 13.0–17.0)
MCH: 30.9 pg (ref 26.0–34.0)
MCHC: 34.1 g/dL (ref 30.0–36.0)
MCV: 90.5 fL (ref 80.0–100.0)
Platelets: 211 K/uL (ref 150–400)
RBC: 4.44 MIL/uL (ref 4.22–5.81)
RDW: 12.4 % (ref 11.5–15.5)
WBC: 12.5 K/uL — ABNORMAL HIGH (ref 4.0–10.5)
nRBC: 0 % (ref 0.0–0.2)

## 2024-01-09 LAB — BASIC METABOLIC PANEL WITH GFR
Anion gap: 9 (ref 5–15)
BUN: 10 mg/dL (ref 8–23)
CO2: 24 mmol/L (ref 22–32)
Calcium: 9.2 mg/dL (ref 8.9–10.3)
Chloride: 103 mmol/L (ref 98–111)
Creatinine, Ser: 0.63 mg/dL (ref 0.61–1.24)
GFR, Estimated: >60 mL/min
Glucose, Bld: 119 mg/dL — ABNORMAL HIGH (ref 70–99)
Potassium: 4 mmol/L (ref 3.5–5.1)
Sodium: 136 mmol/L (ref 135–145)

## 2024-01-09 LAB — SURGICAL PATHOLOGY

## 2024-01-09 NOTE — Progress Notes (Signed)
 Physical Therapy Treatment Patient Details Name: Alan Leblanc MRN: [id number] DOB: 1960-01-20 Today's Date: 01/09/2024   History of Present Illness 63 yo male presents to therapy s/p R TKA on 01/08/2024 due to failure of conservative measures. Pt PMH includes but is not limited to: hernia, GERD, anemia, cervical, rib and skull fx, OSA, and R ankle fx.    PT Comments  POD # 1 am session Assisted OOB to amb in hallway, practice stairs, Then returned to room to perform some TE's following HEP handout.  Instructed on proper tech, freq as well as use of ICE.   Will repeat stairs for Son when he arrives. Expected to D/C before lunch.  EARLY BIRD.    If plan is discharge home, recommend the following: A little help with walking and/or transfers;A little help with bathing/dressing/bathroom;Assistance with cooking/housework;Assist for transportation;Help with stairs or ramp for entrance   Can travel by private vehicle        Equipment Recommendations  Rolling walker (2 wheels)    Recommendations for Other Services       Precautions / Restrictions Precautions Precautions: Fall;Knee Precaution/Restrictions Comments: no pillow under knee Restrictions Weight Bearing Restrictions Per Provider Order: No RLE Weight Bearing Per Provider Order: Weight bearing as tolerated     Mobility  Bed Mobility Overal bed mobility: Needs Assistance Bed Mobility: Supine to Sit     Supine to sit: Supervision, HOB elevated     General bed mobility comments: increased time    Transfers Overall transfer level: Needs assistance Equipment used: Rolling walker (2 wheels) Transfers: Sit to/from Stand Sit to Stand: Supervision           General transfer comment: increased time    Ambulation/Gait Ambulation/Gait assistance: Supervision Gait Distance (Feet): 65 Feet Assistive device: Rolling walker (2 wheels) Gait Pattern/deviations: Step-to pattern, Decreased stance time - right,  Antalgic Gait velocity: decreased     General Gait Details: slight trunk flexion with B UE support at RW to offload R LE in stance phase, cues for RW management including maintaining RW on floor   Stairs Stairs: Yes Stairs assistance: Min assist Stair Management: No rails, Step to pattern, Backwards, With walker Number of Stairs: 4 General stair comments: up backward with walker Min Assist to secure walker   Wheelchair Mobility     Tilt Bed    Modified Rankin (Stroke Patients Only)       Balance                                            Communication Communication Communication: No apparent difficulties  Cognition Arousal: Alert Behavior During Therapy: WFL for tasks assessed/performed   PT - Cognitive impairments: No apparent impairments                       PT - Cognition Comments: AxOx 3 pleasant and motivated. Following commands: Intact      Cueing Cueing Techniques: Verbal cues  Exercises  Total Knee Replacement TE's following HEP handout 10 reps B LE ankle pumps 05 reps towel squeezes 05 reps knee presses 05 reps heel slides  05 reps SAQ's 05 reps SLR's 05 reps ABD Educated on use of gait belt to assist with TE's Followed by ICE     General Comments        Pertinent Vitals/Pain Pain Assessment Pain Assessment: 0-10  Pain Score: 5  Pain Intervention(s): Monitored during session, Premedicated before session, Repositioned, Ice applied    Home Living                          Prior Function            PT Goals (current goals can now be found in the care plan section) Progress towards PT goals: Progressing toward goals    Frequency    7X/week      PT Plan      Co-evaluation              AM-PAC PT 6 Clicks Mobility   Outcome Measure  Help needed turning from your back to your side while in a flat bed without using bedrails?: None Help needed moving from lying on your back to  sitting on the side of a flat bed without using bedrails?: None Help needed moving to and from a bed to a chair (including a wheelchair)?: None Help needed standing up from a chair using your arms (e.g., wheelchair or bedside chair)?: None Help needed to walk in hospital room?: A Little Help needed climbing 3-5 steps with a railing? : A Little 6 Click Score: 22    End of Session Equipment Utilized During Treatment: Gait belt Activity Tolerance: Patient tolerated treatment well Patient left: in chair;with call bell/phone within reach;with chair alarm set Nurse Communication: Mobility status PT Visit Diagnosis: Unsteadiness on feet (R26.81);Other abnormalities of gait and mobility (R26.89);Muscle weakness (generalized) (M62.81);Difficulty in walking, not elsewhere classified (R26.2);Pain Pain - Right/Left: Right Pain - part of body: Knee;Leg     Time: 9097-9057 PT Time Calculation (min) (ACUTE ONLY): 40 min  Charges:    $Gait Training: 8-22 mins $Therapeutic Exercise: 8-22 mins $Therapeutic Activity: 8-22 mins PT General Charges $$ ACUTE PT VISIT: 1 Visit                    Katheryn Leap  PTA Acute  Rehabilitation Services Office M-F          [phone]

## 2024-01-09 NOTE — Progress Notes (Signed)
 Physical Therapy Treatment Patient Details Name: Alan Leblanc MRN: [id number] DOB: 1960/04/24 Today's Date: 01/09/2024   History of Present Illness 63 yo male presents to therapy s/p R TKA on 01/08/2024 due to failure of conservative measures. Pt PMH includes but is not limited to: hernia, GERD, anemia, cervical, rib and skull fx, OSA, and R ankle fx.    PT Comments  POD # 1 Demonstrated and Educated Son proper tech to go up stairs backward with walker.     If plan is discharge home, recommend the following: A little help with walking and/or transfers;A little help with bathing/dressing/bathroom;Assistance with cooking/housework;Assist for transportation;Help with stairs or ramp for entrance   Can travel by private vehicle        Equipment Recommendations  Rolling walker (2 wheels)    Recommendations for Other Services       Precautions / Restrictions Precautions Precautions: Fall;Knee Precaution/Restrictions Comments: no pillow under knee Restrictions Weight Bearing Restrictions Per Provider Order: No RLE Weight Bearing Per Provider Order: Weight bearing as tolerated     Mobility     Stairs Stairs: Yes Stairs assistance: Min assist Stair Management: No rails, Step to pattern, Backwards, With walker Number of Stairs: 4 General stair comments: up backward with walker Min Assist to secure walker   Wheelchair Mobility     Tilt Bed    Modified Rankin (Stroke Patients Only)       Balance                                            Communication Communication Communication: No apparent difficulties  Cognition Arousal: Alert Behavior During Therapy: WFL for tasks assessed/performed   PT - Cognitive impairments: No apparent impairments                       PT - Cognition Comments: AxOx 3 pleasant and motivated. Following commands: Intact      Cueing Cueing Techniques: Verbal cues  Exercises      General Comments         Pertinent Vitals/Pain Pain Assessment Pain Assessment: 0-10 Pain Score: 5  Pain Intervention(s): Monitored during session, Premedicated before session, Repositioned, Ice applied    Home Living                          Prior Function            PT Goals (current goals can now be found in the care plan section) Progress towards PT goals: Progressing toward goals    Frequency    7X/week      PT Plan      Co-evaluation              AM-PAC PT 6 Clicks Mobility   Outcome Measure  Help needed turning from your back to your side while in a flat bed without using bedrails?: None Help needed moving from lying on your back to sitting on the side of a flat bed without using bedrails?: None Help needed moving to and from a bed to a chair (including a wheelchair)?: None Help needed standing up from a chair using your arms (e.g., wheelchair or bedside chair)?: None Help needed to walk in hospital room?: A Little Help needed climbing 3-5 steps with a railing? : A Little 6 Click Score: 22  End of Session Equipment Utilized During Treatment: Gait belt Activity Tolerance: Patient tolerated treatment well Patient left: in chair;with call bell/phone within reach;with chair alarm set Nurse Communication: Mobility status PT Visit Diagnosis: Unsteadiness on feet (R26.81);Other abnormalities of gait and mobility (R26.89);Muscle weakness (generalized) (M62.81);Difficulty in walking, not elsewhere classified (R26.2);Pain Pain - Right/Left: Right Pain - part of body: Knee;Leg     Time: 8885-8874 PT Time Calculation (min) (ACUTE ONLY): 11 min  Charges:     $Self Care/Home Management: 8-22 PT General Charges $$ ACUTE PT VISIT: 1 Visit

## 2024-01-09 NOTE — Progress Notes (Signed)
" ° ° ° °  Subjective:  Patient reports pain as mild.  Did well with PT yesterday. No concerns overnight. Plan for dc home today.  Yesterday's total administered Morphine Milligram Equivalents: 70   Objective:   VITALS:   Vitals:   01/08/24 1435 01/08/24 1806 01/08/24 2222 01/09/24 0140  BP: 135/84 131/79 138/74 138/70  Pulse: 81 81 85 78  Resp: 17 18 18 17   Temp: (!) 97.5 F (36.4 C) 97.8 F (36.6 C) 97.9 F (36.6 C) 98.7 F (37.1 C)  TempSrc: Oral Oral Oral Oral  SpO2: 98% 98% 96% 96%  Weight:      Height:        Sensation intact distally Intact pulses distally Dorsiflexion/Plantar flexion intact Incision: dressing C/D/I Compartment soft    Lab Results  Component Value Date   WBC 12.5 (H) 01/09/2024   HGB 13.7 01/09/2024   HCT 40.2 01/09/2024   MCV 90.5 01/09/2024   PLT 211 01/09/2024   BMET    Component Value Date/Time   NA 136 01/09/2024 0348   K 4.0 01/09/2024 0348   CL 103 01/09/2024 0348   CO2 24 01/09/2024 0348   GLUCOSE 119 (H) 01/09/2024 0348   BUN 10 01/09/2024 0348   CREATININE 0.63 01/09/2024 0348   CREATININE 0.69 (L) 04/15/2015 1521   CALCIUM 9.2 01/09/2024 0348   GFRNONAA >60 01/09/2024 0348    Xray: TKA components in good position no adverse features  Assessment/Plan: 1 Day Post-Op   Principal Problem:   Primary osteoarthritis of right knee  S/p R TKA 01/08/24  Post op recs: WB: WBAT Abx: Vanco and Cipro  given history of severe penicillin allergy Imaging: PACU xrays DVT prophylaxis: Aspirin  81mg  BID x4 weeks  Follow up: 2 weeks after surgery for a wound check with Dr. Edna at [facility].  Address: [address], [place], [place] [address]  Office Phone: [phone]    TORIBIO DELENA EDNA 01/09/2024, 6:56 AM   Toribio Edna, MD  Contact information:   [phone] 7am-5pm epic message Dr. Edna, or call office for patient follow up: [phone] After hours and holidays please check  [email or web address] for group call information for [facility]   "

## 2024-01-09 NOTE — TOC Transition Note (Signed)
 Transition of Care [facility]) - Discharge Note   Patient Details  Name: Alan Leblanc MRN: [id number] Date of Birth: 11/20/1960  Transition of Care [facility]) CM/SW Contact:  Alfonse JONELLE Rex, RN Phone Number: 01/09/2024, 9:54 AM   Clinical Narrative:  Met with patient at bedside to review dc therapy and home equipment needs, pt confirmed OPPT  ([facility]), needs RW, RW delivered to bedside by [employer]. No INPT CM needs      Final next level of care: OP Rehab Barriers to Discharge: No Barriers Identified   Patient Goals and CMS Choice Patient states their goals for this hospitalization and ongoing recovery are:: reeturn home          Discharge Placement                       Discharge Plan and Services Additional resources added to the After Visit Summary for                                       Social Drivers of Health (SDOH) Interventions SDOH Screenings   Food Insecurity: No Food Insecurity (01/08/2024)  Housing: Low Risk (01/08/2024)  Recent Concern: Housing - High Risk (12/10/2023)   Received from [facility]  Transportation Needs: No Transportation Needs (01/08/2024)  Utilities: Not At Risk (01/08/2024)  Financial Resource Strain: Medium Risk (12/10/2023)   Received from [facility]  Physical Activity: Sufficiently Active (12/10/2023)   Received from [facility]  Social Connections: Unknown (01/08/2024)  Stress: No Stress Concern Present (12/11/2023)   Received from [facility]  Tobacco Use: Low Risk (01/08/2024)     Readmission Risk Interventions     No data to display

## 2024-01-09 NOTE — Discharge Summary (Signed)
 Physician Discharge Summary  Patient ID: Alan Leblanc MRN: [id number] DOB/AGE: Jan 26, 1960 63 y.o.  Admit date: 01/08/2024 Discharge date: 01/09/2024  Admission Diagnoses:  Primary osteoarthritis of right knee  Discharge Diagnoses:  Principal Problem:   Primary osteoarthritis of right knee   Past Medical History:  Diagnosis Date   Anemia    Bone infection (HCC)    Broken neck (HCC) 2003   Cholecystitis    Fatty liver    GERD (gastroesophageal reflux disease)    History of migraine    OA (osteoarthritis) of knee    End stage   Rib fractures 2003   Skull fracture (HCC) 2003   Sleep apnea    lost weight no CPAP    Surgeries: Procedures: ARTHROPLASTY, KNEE, TOTAL on 01/08/2024   Consultants (if any):   Discharged Condition: Improved  Hospital Course: Alan Leblanc is an 63 y.o. male who was admitted 01/08/2024 with a diagnosis of Primary osteoarthritis of right knee and went to the operating room on 01/08/2024 and underwent the above named procedures.    He was given perioperative antibiotics:  Anti-infectives (From admission, onward)    Start     Dose/Rate Route Frequency Ordered Stop   01/08/24 1930  ciprofloxacin  (CIPRO ) IVPB 400 mg        400 mg 200 mL/hr over 60 Minutes Intravenous Every 12 hours 01/08/24 1117 01/11/24 1929   01/08/24 1930  vancomycin  (VANCOREADY) IVPB 1250 mg/250 mL        1,250 mg 166.7 mL/hr over 90 Minutes Intravenous Every 12 hours 01/08/24 1117 01/11/24 1929   01/08/24 1600  ciprofloxacin  (CIPRO ) IVPB 400 mg  Status:  Discontinued        400 mg 200 mL/hr over 60 Minutes Intravenous Every 8 hours 01/08/24 1133 01/08/24 1141   01/08/24 0945  vancomycin  (VANCOCIN ) IVPB 1000 mg/200 mL premix  Status:  Discontinued        1,000 mg 200 mL/hr over 60 Minutes Intravenous Every 12 hours 01/08/24 0935 01/08/24 1117   01/08/24 0600  ciprofloxacin  (CIPRO ) IVPB 400 mg  Status:  Discontinued        400 mg 200 mL/hr over 60 Minutes Intravenous  Every 8 hours 01/08/24 0535 01/08/24 1111   01/08/24 0545  vancomycin  (VANCOCIN ) IVPB 1000 mg/200 mL premix        1,000 mg 200 mL/hr over 60 Minutes Intravenous  Once 01/08/24 0535 01/08/24 1326   01/08/24 0000  sulfamethoxazole -trimethoprim  (BACTRIM  DS) 800-160 MG tablet        1 tablet Oral 2 times daily 01/08/24 1007 01/15/24 2359     .  He was given sequential compression devices, early ambulation, and aspirin  for DVT prophylaxis.  He benefited maximally from the hospital stay and there were no complications.    Recent vital signs:  Vitals:   01/08/24 2222 01/09/24 0140  BP: 138/74 138/70  Pulse: 85 78  Resp: 18 17  Temp: 97.9 F (36.6 C) 98.7 F (37.1 C)  SpO2: 96% 96%    Recent laboratory studies:  Lab Results  Component Value Date   HGB 13.7 01/09/2024   HGB 15.6 01/01/2024   HGB 15.4 03/23/2023   Lab Results  Component Value Date   WBC 12.5 (H) 01/09/2024   PLT 211 01/09/2024   No results found for: INR Lab Results  Component Value Date   NA 136 01/09/2024   K 4.0 01/09/2024   CL 103 01/09/2024   CO2 24 01/09/2024   BUN 10  01/09/2024   CREATININE 0.63 01/09/2024   GLUCOSE 119 (H) 01/09/2024    Discharge Medications:   Allergies as of 01/09/2024       Reactions   Penicillins Other (See Comments)   Unsure of reaction        Medication List     TAKE these medications    acetaminophen  500 MG tablet Commonly known as: TYLENOL  Take 2 tablets (1,000 mg total) by mouth every 8 (eight) hours as needed.   aspirin  EC 81 MG tablet Take 1 tablet (81 mg total) by mouth 2 (two) times daily for 28 days. Swallow whole.   celecoxib  100 MG capsule Commonly known as: CeleBREX  Take 1 capsule (100 mg total) by mouth 2 (two) times daily for 14 days.   famotidine  20 MG tablet Commonly known as: PEPCID  Take 20 mg by mouth daily as needed for heartburn or indigestion.   MAGNESIUM CHLORIDE-CALCIUM PO Take 1 tablet by mouth daily.   methocarbamol  500  MG tablet Commonly known as: ROBAXIN  Take 1 tablet (500 mg total) by mouth every 8 (eight) hours as needed for up to 10 days for muscle spasms.   omeprazole  40 MG capsule Commonly known as: PRILOSEC Take 40 mg by mouth daily.   ondansetron  4 MG tablet Commonly known as: Zofran  Take 1 tablet (4 mg total) by mouth every 8 (eight) hours as needed for up to 14 days for nausea or vomiting.   oxyCODONE  5 MG immediate release tablet Commonly known as: Roxicodone  Take 1 tablet (5 mg total) by mouth every 4 (four) hours as needed for up to 7 days for severe pain (pain score 7-10) or moderate pain (pain score 4-6).   polyethylene glycol 17 g packet Commonly known as: MiraLax  Take 17 g by mouth daily.   sulfamethoxazole -trimethoprim  800-160 MG tablet Commonly known as: BACTRIM  DS Take 1 tablet by mouth 2 (two) times daily for 7 days.        Diagnostic Studies: DG Knee Right Port Result Date: 01/08/2024 CLINICAL DATA:  Postop right knee replacement. EXAM: PORTABLE RIGHT KNEE - 1-2 VIEW COMPARISON:  None Available. FINDINGS: Right knee arthroplasty in expected alignment. No periprosthetic lucency or fracture. There has been patellar resurfacing. Recent postsurgical change includes air and edema in the soft tissues and joint space. IMPRESSION: Right knee arthroplasty without immediate postoperative complication. Electronically Signed   By: Andrea Gasman M.D.   On: 01/08/2024 13:59    Disposition: Discharge disposition: 01-Home or Self Care       Discharge Instructions     Call MD / Call 911   Complete by: As directed    If you experience chest pain or shortness of breath, CALL 911 and be transported to the hospital emergency room.  If you develope a fever above 101 F, pus (white drainage) or increased drainage or redness at the wound, or calf pain, call your surgeon's office.   Call MD / Call 911   Complete by: As directed    If you experience chest pain or shortness of breath,  CALL 911 and be transported to the hospital emergency room.  If you develope a fever above 101 F, pus (white drainage) or increased drainage or redness at the wound, or calf pain, call your surgeon's office.   Constipation Prevention   Complete by: As directed    Drink plenty of fluids.  Prune juice may be helpful.  You may use a stool softener, such as Colace (over the counter) 100 mg twice  a day.  Use MiraLax  (over the counter) for constipation as needed.   Constipation Prevention   Complete by: As directed    Drink plenty of fluids.  Prune juice may be helpful.  You may use a stool softener, such as Colace (over the counter) 100 mg twice a day.  Use MiraLax  (over the counter) for constipation as needed.   Diet - low sodium heart healthy   Complete by: As directed    Do not put a pillow under the knee. Place it under the heel.   Complete by: As directed    Driving restrictions   Complete by: As directed    No driving for 4-6 weeks   Increase activity slowly as tolerated   Complete by: As directed    Increase activity slowly as tolerated   Complete by: As directed    Post-operative opioid taper instructions:   Complete by: As directed    POST-OPERATIVE OPIOID TAPER INSTRUCTIONS: It is important to wean off of your opioid medication as soon as possible. If you do not need pain medication after your surgery it is ok to stop day one. Opioids include: Codeine, Hydrocodone (Norco, Vicodin), Oxycodone (Percocet, oxycontin ) and hydromorphone  amongst others.  Long term and even short term use of opiods can cause: Increased pain response Dependence Constipation Depression Respiratory depression And more.  Withdrawal symptoms can include Flu like symptoms Nausea, vomiting And more Techniques to manage these symptoms Hydrate well Eat regular healthy meals Stay active Use relaxation techniques(deep breathing, meditating, yoga) Do Not substitute Alcohol  to help with tapering If you have  been on opioids for less than two weeks and do not have pain than it is ok to stop all together.  Plan to wean off of opioids This plan should start within one week post op of your joint replacement. Maintain the same interval or time between taking each dose and first decrease the dose.  Cut the total daily intake of opioids by one tablet each day Next start to increase the time between doses. The last dose that should be eliminated is the evening dose.      Post-operative opioid taper instructions:   Complete by: As directed    POST-OPERATIVE OPIOID TAPER INSTRUCTIONS: It is important to wean off of your opioid medication as soon as possible. If you do not need pain medication after your surgery it is ok to stop day one. Opioids include: Codeine, Hydrocodone (Norco, Vicodin), Oxycodone (Percocet, oxycontin ) and hydromorphone  amongst others.  Long term and even short term use of opiods can cause: Increased pain response Dependence Constipation Depression Respiratory depression And more.  Withdrawal symptoms can include Flu like symptoms Nausea, vomiting And more Techniques to manage these symptoms Hydrate well Eat regular healthy meals Stay active Use relaxation techniques(deep breathing, meditating, yoga) Do Not substitute Alcohol  to help with tapering If you have been on opioids for less than two weeks and do not have pain than it is ok to stop all together.  Plan to wean off of opioids This plan should start within one week post op of your joint replacement. Maintain the same interval or time between taking each dose and first decrease the dose.  Cut the total daily intake of opioids by one tablet each day Next start to increase the time between doses. The last dose that should be eliminated is the evening dose.      TED hose   Complete by: As directed    Use stockings (TED hose) for 2 weeks on  both leg(s).  Then for 2 more weeks on the surgical leg.  You may remove them  at night for sleeping.        Follow-up Information     Edna Toribio LABOR, MD Follow up in 2 week(s).   Specialty: Orthopedic Surgery Contact information: [address] [place] [address] [phone]                    Discharge Instructions      INSTRUCTIONS AFTER JOINT REPLACEMENT   Remove items at home which could result in a fall. This includes throw rugs or furniture in walking pathways ICE to the affected joint every three hours while awake for 30 minutes at a time, for at least the first 3-5 days, and then as needed for pain and swelling.  Continue to use ice for pain and swelling. You may notice swelling that will progress down to the foot and ankle.  This is normal after surgery.  Elevate your leg when you are not up walking on it.   Continue to use the breathing machine you got in the hospital (incentive spirometer) which will help keep your temperature down.  It is common for your temperature to cycle up and down following surgery, especially at night when you are not up moving around and exerting yourself.  The breathing machine keeps your lungs expanded and your temperature down.  DIET:  As you were doing prior to hospitalization, we recommend a well-balanced diet.  DRESSING / WOUND CARE / SHOWERING:  Keep the surgical dressing until follow up.  The dressing is water  proof, so you can shower without any extra covering.  IF THE DRESSING FALLS OFF or the wound gets wet inside, change the dressing with sterile gauze.  Please use good hand washing techniques before changing the dressing.  Do not use any lotions or creams on the incision until instructed by your surgeon.    ACTIVITY  Increase activity slowly as tolerated, but follow the weight bearing instructions below.   No driving for 6 weeks or until further direction given by your physician.  You cannot drive while taking narcotics.  No lifting or carrying greater than 10 lbs. until further  directed by your surgeon. Avoid periods of inactivity such as sitting longer than an hour when not asleep. This helps prevent blood clots.  You may return to work once you are authorized by your doctor.   WEIGHT BEARING: Weight bearing as tolerated with assist device (walker, cane, etc) as directed, use it as long as suggested by your surgeon or therapist, typically at least 4-6 weeks.  EXERCISES  Results after joint replacement surgery are often greatly improved when you follow the exercise, range of motion and muscle strengthening exercises prescribed by your doctor. Safety measures are also important to protect the joint from further injury. Any time any of these exercises cause you to have increased pain or swelling, decrease what you are doing until you are comfortable again and then slowly increase them. If you have problems or questions, call your caregiver or physical therapist for advice.   Rehabilitation is important following a joint replacement. After just a few days of immobilization, the muscles of the leg can become weakened and shrink (atrophy).  These exercises are designed to build up the tone and strength of the thigh and leg muscles and to improve motion. Often times heat used for twenty to thirty minutes before working out will loosen up your  tissues and help with improving the range of motion but do not use heat for the first two weeks following surgery (sometimes heat can increase post-operative swelling).   These exercises can be done on a training (exercise) mat, on the floor, on a table or on a bed. Use whatever works the best and is most comfortable for you.    Use music or television while you are exercising so that the exercises are a pleasant break in your day. This will make your life better with the exercises acting as a break in your routine that you can look forward to.   Perform all exercises about fifteen times, three times per day or as directed.  You should exercise  both the operative leg and the other leg as well.  Exercises include:   Quad Sets - Tighten up the muscle on the front of the thigh (Quad) and hold for 5-10 seconds.   Straight Leg Raises - With your knee straight (if you were given a brace, keep it on), lift the leg to 60 degrees, hold for 3 seconds, and slowly lower the leg.  Perform this exercise against resistance later as your leg gets stronger.  Leg Slides: Lying on your back, slowly slide your foot toward your buttocks, bending your knee up off the floor (only go as far as is comfortable). Then slowly slide your foot back down until your leg is flat on the floor again.  Angel Wings: Lying on your back spread your legs to the side as far apart as you can without causing discomfort.  Hamstring Strength:  Lying on your back, push your heel against the floor with your leg straight by tightening up the muscles of your buttocks.  Repeat, but this time bend your knee to a comfortable angle, and push your heel against the floor.  You may put a pillow under the heel to make it more comfortable if necessary.   A rehabilitation program following joint replacement surgery can speed recovery and prevent re-injury in the future due to weakened muscles. Contact your doctor or a physical therapist for more information on knee rehabilitation.   CONSTIPATION:  Constipation is defined medically as fewer than three stools per week and severe constipation as less than one stool per week.  Even if you have a regular bowel pattern at home, your normal regimen is likely to be disrupted due to multiple reasons following surgery.  Combination of anesthesia, postoperative narcotics, change in appetite and fluid intake all can affect your bowels.   YOU MUST use at least one of the following options; they are listed in order of increasing strength to get the job done.  They are all available over the counter, and you may need to use some, POSSIBLY even all of these options:     Drink plenty of fluids (prune juice may be helpful) and high fiber foods Colace 100 mg by mouth twice a day  Senokot for constipation as directed and as needed Dulcolax (bisacodyl), take with full glass of water   Miralax  (polyethylene glycol) once or twice a day as needed.  If you have tried all these things and are unable to have a bowel movement in the first 3-4 days after surgery call either your surgeon or your primary doctor.    If you experience loose stools or diarrhea, hold the medications until you stool forms back up.  If your symptoms do not get better within 1 week or if they get worse, check with  your doctor.  If you experience the worst abdominal pain ever or develop nausea or vomiting, please contact the office immediately for further recommendations for treatment.  ITCHING:  If you experience itching with your medications, try taking only a single pain pill, or even half a pain pill at a time.  You can also use Benadryl  over the counter for itching or also to help with sleep.   TED HOSE STOCKINGS:  Use stockings on both legs until for at least 2 weeks or as directed by physician office. They may be removed at night for sleeping.  MEDICATIONS:  See your medication summary on the After Visit Summary that nursing will review with you.  You may have some home medications which will be placed on hold until you complete the course of blood thinner medication.  It is important for you to complete the blood thinner medication as prescribed.  Blood clot prevention (DVT Prophylaxis): After surgery you are at an increased risk for a blood clot.  You were prescribed a blood thinner, Aspirin  81mg , to be taken twice daily for a total of 4 weeks from surgery to help reduce your risk of getting a blood clot.  Signs of a pulmonary embolus (blood clot in the lungs) include sudden short of breath, feeling lightheaded or dizzy, chest pain with a deep breath, rapid pulse rapid breathing.  Signs of  a blood clot in your arms or legs include new unexplained swelling and cramping, warm, red or darkened skin around the painful area.  Please call the office or 911 right away if these signs or symptoms develop.  PRECAUTIONS:   If you experience chest pain or shortness of breath - call 911 immediately for transfer to the hospital emergency department.   If you develop a fever greater that 101 F, purulent drainage from wound, increased redness or drainage from wound, foul odor from the wound/dressing, or calf pain - CONTACT YOUR SURGEON.                                                   FOLLOW-UP APPOINTMENTS:  If you do not already have a post-op appointment, please call the office for an appointment to be seen by your surgeon.  Guidelines for how soon to be seen are listed in your After Visit Summary, but are typically between 2-3 weeks after surgery.  If you have a specialized bandage, you may be told to follow up 1 week after surgery.  OTHER INSTRUCTIONS:  Knee Replacement:  Do not place pillow under knee, focus on keeping the knee straight while resting.  Place foam block, curve side up under heel at all times except when walking.  DO NOT modify, tear, cut, or change the foam block in any way.  POST-OPERATIVE OPIOID TAPER INSTRUCTIONS: It is important to wean off of your opioid medication as soon as possible. If you do not need pain medication after your surgery it is ok to stop day one. Opioids include: Codeine, Hydrocodone (Norco, Vicodin), Oxycodone (Percocet, oxycontin ) and hydromorphone  amongst others.  Long term and even short term use of opiods can cause: Increased pain response Dependence Constipation Depression Respiratory depression And more.  Withdrawal symptoms can include Flu like symptoms Nausea, vomiting And more Techniques to manage these symptoms Hydrate well Eat regular healthy meals Stay active Use relaxation techniques(deep breathing, meditating, yoga) Do  Not  substitute Alcohol  to help with tapering If you have been on opioids for less than two weeks and do not have pain than it is ok to stop all together.  Plan to wean off of opioids This plan should start within one week post op of your joint replacement. Maintain the same interval or time between taking each dose and first decrease the dose.  Cut the total daily intake of opioids by one tablet each day Next start to increase the time between doses. The last dose that should be eliminated is the evening dose.   MAKE SURE YOU:  Understand these instructions.  Get help right away if you are not doing well or get worse.    Thank you for letting us  be a part of your medical care team.  It is a privilege we respect greatly.  We hope these instructions will help you stay on track for a fast and full recovery!            Signed: Majesti Gambrell A Quatisha Zylka 01/09/2024, 6:57 AM

## 2024-01-13 LAB — AEROBIC/ANAEROBIC CULTURE W GRAM STAIN (SURGICAL/DEEP WOUND)
Culture: NO GROWTH
Gram Stain: NONE SEEN

## 2024-01-16 ENCOUNTER — Ambulatory Visit (HOSPITAL_COMMUNITY)

## 2024-01-17 ENCOUNTER — Other Ambulatory Visit (HOSPITAL_COMMUNITY): Payer: Self-pay | Admitting: Orthopedic Surgery

## 2024-01-17 ENCOUNTER — Ambulatory Visit (HOSPITAL_COMMUNITY)
Admission: RE | Admit: 2024-01-17 | Discharge: 2024-01-17 | Disposition: A | Discharge status: Home / Self Care | Source: Ambulatory Visit | Attending: Vascular Surgery | Admitting: Vascular Surgery

## 2024-01-17 DIAGNOSIS — M79604 Pain in right leg: Secondary | ICD-10-CM | POA: Insufficient documentation

## 2024-01-17 DIAGNOSIS — M7989 Other specified soft tissue disorders: Secondary | ICD-10-CM | POA: Diagnosis present
